# Patient Record
Sex: Male | Born: 1980 | Race: White | Hispanic: No | Marital: Single | Smoking: Current every day smoker
Health system: Southern US, Community
[De-identification: ages and names within clinical notes are randomized; demographics above are authoritative.]

## PROBLEM LIST (undated history)

## (undated) DIAGNOSIS — F1910 Other psychoactive substance abuse, uncomplicated: Secondary | ICD-10-CM

## (undated) HISTORY — PX: HERNIA REPAIR: SHX51

---
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
## 2012-04-19 ENCOUNTER — Emergency Department (HOSPITAL_COMMUNITY)
Admission: EM | Admit: 2012-04-19 | Discharge: 2012-04-19 | Disposition: A | Discharge status: Home / Self Care | Payer: Self-pay | Attending: Emergency Medicine | Admitting: Emergency Medicine

## 2012-04-19 ENCOUNTER — Encounter (HOSPITAL_COMMUNITY): Payer: Self-pay | Admitting: Emergency Medicine

## 2012-04-19 ENCOUNTER — Emergency Department (HOSPITAL_COMMUNITY): Payer: Self-pay

## 2012-04-19 DIAGNOSIS — J45909 Unspecified asthma, uncomplicated: Secondary | ICD-10-CM | POA: Insufficient documentation

## 2012-04-19 DIAGNOSIS — R0789 Other chest pain: Secondary | ICD-10-CM | POA: Insufficient documentation

## 2012-04-19 DIAGNOSIS — F1510 Other stimulant abuse, uncomplicated: Secondary | ICD-10-CM | POA: Insufficient documentation

## 2012-04-19 DIAGNOSIS — R05 Cough: Secondary | ICD-10-CM | POA: Insufficient documentation

## 2012-04-19 DIAGNOSIS — F1990 Other psychoactive substance use, unspecified, uncomplicated: Secondary | ICD-10-CM

## 2012-04-19 DIAGNOSIS — R059 Cough, unspecified: Secondary | ICD-10-CM | POA: Insufficient documentation

## 2012-04-19 DIAGNOSIS — R450 Nervousness: Secondary | ICD-10-CM | POA: Insufficient documentation

## 2012-04-19 DIAGNOSIS — R0982 Postnasal drip: Secondary | ICD-10-CM | POA: Insufficient documentation

## 2012-04-19 DIAGNOSIS — R0602 Shortness of breath: Secondary | ICD-10-CM | POA: Insufficient documentation

## 2012-04-19 DIAGNOSIS — R6889 Other general symptoms and signs: Secondary | ICD-10-CM | POA: Insufficient documentation

## 2012-04-19 DIAGNOSIS — F1410 Cocaine abuse, uncomplicated: Secondary | ICD-10-CM | POA: Insufficient documentation

## 2012-04-19 DIAGNOSIS — F17200 Nicotine dependence, unspecified, uncomplicated: Secondary | ICD-10-CM | POA: Insufficient documentation

## 2012-04-19 DIAGNOSIS — I498 Other specified cardiac arrhythmias: Secondary | ICD-10-CM | POA: Insufficient documentation

## 2012-04-19 DIAGNOSIS — R0682 Tachypnea, not elsewhere classified: Secondary | ICD-10-CM | POA: Insufficient documentation

## 2012-04-19 DIAGNOSIS — J3489 Other specified disorders of nose and nasal sinuses: Secondary | ICD-10-CM | POA: Insufficient documentation

## 2012-04-19 LAB — COMPREHENSIVE METABOLIC PANEL
ALT: 26 U/L (ref 0–53)
AST: 34 U/L (ref 0–37)
Albumin: 4.6 g/dL (ref 3.5–5.2)
Alkaline Phosphatase: 87 U/L (ref 39–117)
BUN: 17 mg/dL (ref 6–23)
Chloride: 102 mEq/L (ref 96–112)
Potassium: 3.2 mEq/L — ABNORMAL LOW (ref 3.5–5.1)
Sodium: 139 mEq/L (ref 135–145)
Total Bilirubin: 1.7 mg/dL — ABNORMAL HIGH (ref 0.3–1.2)
Total Protein: 7.7 g/dL (ref 6.0–8.3)

## 2012-04-19 LAB — RAPID URINE DRUG SCREEN, HOSP PERFORMED
Amphetamines: POSITIVE — AB
Barbiturates: NOT DETECTED
Tetrahydrocannabinol: NOT DETECTED

## 2012-04-19 LAB — POCT I-STAT TROPONIN I: Troponin i, poc: 0.01 ng/mL (ref 0.00–0.08)

## 2012-04-19 LAB — CBC
Hemoglobin: 16.8 g/dL (ref 13.0–17.0)
MCHC: 35.7 g/dL (ref 30.0–36.0)
Platelets: 163 10*3/uL (ref 150–400)

## 2012-04-19 LAB — DIFFERENTIAL
Basophils Absolute: 0 10*3/uL (ref 0.0–0.1)
Basophils Relative: 0 % (ref 0–1)
Eosinophils Absolute: 0.7 10*3/uL (ref 0.0–0.7)
Monocytes Relative: 12 % (ref 3–12)
Neutro Abs: 6.3 10*3/uL (ref 1.7–7.7)
Neutrophils Relative %: 64 % (ref 43–77)

## 2012-04-19 LAB — D-DIMER, QUANTITATIVE: D-Dimer, Quant: 0.39 ug/mL-FEU (ref 0.00–0.48)

## 2012-04-19 MED ORDER — LORAZEPAM 2 MG/ML IJ SOLN
1.0000 mg | Freq: Once | INTRAMUSCULAR | Status: DC
  Filled 2012-04-19: qty 1

## 2012-04-19 MED ORDER — LORAZEPAM 1 MG PO TABS: 1.0000 mg | ORAL_TABLET | Freq: Once | ORAL | Status: DC

## 2012-04-19 MED ORDER — ALBUTEROL SULFATE (5 MG/ML) 0.5% IN NEBU
5.0000 mg | INHALATION_SOLUTION | Freq: Once | RESPIRATORY_TRACT | Status: AC
  Administered 2012-04-19: 5 mg via RESPIRATORY_TRACT
  Filled 2012-04-19: qty 1

## 2012-04-19 MED ORDER — LORAZEPAM 1 MG PO TABS
2.0000 mg | ORAL_TABLET | Freq: Once | ORAL | Status: AC
  Administered 2012-04-19: 2 mg via ORAL
  Filled 2012-04-19: qty 2

## 2012-04-19 NOTE — ED Notes (Signed)
Pt noted to be very anxious, moving a lot and red in his face.  Reports that he is sleepy.  States that the last time he used 'meth' was 2 days ago and it was 'clear as glass'.  Reports that the last time that he used was in high school.  Pt not noted to be coughing, albuterol treatment finished.  No distress noted.  No wheezing.

## 2012-04-19 NOTE — ED Notes (Signed)
PT. REPORTS PROGRESSING SOB WITH PRODUCTIVE COUGH AND NASAL CONGESTION Kyle Wilkins NOSE FOR SEVERAL DAYS .

## 2012-04-19 NOTE — ED Notes (Signed)
Patient presents with dry cough and congestion.  Denies any other symptoms

## 2012-04-19 NOTE — ED Notes (Signed)
Meal provided.  Patient eating without difficulty.  Denies needs at present

## 2012-04-19 NOTE — ED Notes (Signed)
Attempted to ambulate patient- he remains unsteady on his gait.  Continue to try and reach someone to pick patient up.  He is currently unable to give any names or telephones.

## 2012-04-19 NOTE — Discharge Instructions (Signed)
Please read and follow all provided instructions.  Your diagnoses today include:  1. Drug use     Tests performed today include:  Chest x-ray which was normal  Blood counts and electrolytes that were normal  Vital signs. See below for your results today.   Medications prescribed:   None  Home care instructions:  Follow any educational materials contained in this packet.  Follow-up instructions: Please follow-up with your primary care provider in the next 3 days for further evaluation of your symptoms. If you do not have a primary care doctor -- see below for referral information.   Return instructions:   Please return to the Emergency Department if you experience worsening symptoms.   Please return if you have any other emergent concerns.  Additional Information:  Your vital signs today were: BP 137/83  Pulse 116  Temp(Src) 97.4 F (36.3 C) (Oral)  Resp 28  SpO2 94% If your blood pressure (BP) was elevated above 135/85 this visit, please have this repeated by your doctor within one month. -------------- No Primary Care Doctor Call [facility]  [phone] Other agencies that provide inexpensive medical care    [facility]  [phone]    [facility]  [phone]    [facility]  [phone]    [facility]  [phone]    [facility]  [phone]    [facility]  [phone] -------------- RESOURCE GUIDE:  Dental Problems  Patients with Medicaid: [facility] [phone] [address]                                            [phone] [address] Phone:  [phone]                                                   Phone:  [phone]  If unable to pay or uninsured, contact:  [facility] or [facility]. to become qualified for the adult dental clinic.  Chronic Pain Problems Contact [facility]  [phone] Patients need to be referred by  their primary care doctor.  Insufficient Money for Medicine Contact [employer]:  call "211" or [facility] [phone].  Psychological Services [facility]  [phone] [facility]  [phone] [facility]   [phone] (emergency services [phone])  Substance Abuse Resources [facility]  [phone] [facility] [phone] [facility] [place] [phone] Floydene Flock [phone] Residential & Outpatient Substance Abuse Program  [phone]  Abuse/Neglect [facility] Child Abuse Hotline [phone] [facility] Child Abuse Hotline 339-273-2442 (After Hours)  Emergency Shelter [facility] [phone]  Maternity Homes Room at the [place] [facility] [place] [phone] [place] [facility] [phone]  [facility]  [facility] [place]     [employer]                          [facility] Dept. [address] [place]  [address]      [address]  Blondell Reveal Phone:  [phone]                                   Phone:  [phone]                 Phone:  [phone]  [facility] Mental Health Phone:  [phone]  [facility] Child Abuse Hotline 2348353410 [phone] (After Hours)

## 2012-04-19 NOTE — ED Notes (Signed)
Patient friend Barbara Cower was called for a ride and message was left for him to call [facility] to pick up patient.

## 2012-04-19 NOTE — ED Notes (Signed)
Aunt is coming to pick up patient at  5:30.  Telephone [phone].

## 2012-04-19 NOTE — ED Notes (Signed)
Explained to Patient we need a urine sample.  Patient stated he did not need to urinate at this time.  Patient seemed to understand at this time.

## 2012-04-19 NOTE — ED Notes (Signed)
Friend on telephone with patient.

## 2012-04-19 NOTE — ED Provider Notes (Signed)
Medical screening examination/treatment/procedure(s) were performed by non-physician practitioner and as supervising physician I was immediately available for consultation/collaboration.  Sunnie Nielsen, MD 04/19/12 [phone]

## 2012-04-19 NOTE — ED Notes (Signed)
Discharged home in care of Jacinto Halim.   Written and verbal instructions given to patient/family

## 2012-04-19 NOTE — ED Provider Notes (Signed)
History     CSN: [id number]  Arrival date & time 04/19/12  4098   First MD Initiated Contact with Patient 04/19/12 [phone]      Chief Complaint  Patient presents with  . Cough    (Consider location/radiation/quality/duration/timing/severity/associated sxs/prior treatment) HPI Comments: 31 yo male with history of asthma presents to the ED this morning for increasing shortness of breath associated with wheezing, dry cough and nasal congestion.  Reports seasonal allergy symptoms  Including nasal congestion, itchy watery eyes, rhinorrhea, cough beginning 4 days ago (04/15/2012).  Two days ago patient developed SOB, wheezing, chest tightness when friend offered methamphetamine that was crushed into a tea to sooth his cough.  Patient states that this was when his cough and shortness of breath began.  It has made him anxious. No fever. Patient denies loss of consciousness, headache, abdominal pain, chest pain, nausea, vomiting, diarrhea, ear pain.  Patient is a 31 y.o. male presenting with cough. The history is provided by the patient.  Cough This is a new problem. The current episode started 2 days ago. The problem has not changed since onset.The cough is non-productive. There has been no fever. Associated symptoms include rhinorrhea, shortness of breath and wheezing. Pertinent negatives include no chest pain, no chills, no ear pain, no headaches, no sore throat and no myalgias. He has tried nothing for the symptoms. He is a smoker. His past medical history is significant for asthma.    Past Medical History  Diagnosis Date  . Asthma     Past Surgical History  Procedure Date  . Hernia repair     No family history on file.  History  Substance Use Topics  . Smoking status: Current Everyday Smoker  . Smokeless tobacco: Not on file  . Alcohol Use: Yes      Review of Systems  Constitutional: Negative for fever, chills and fatigue.  HENT: Positive for congestion, rhinorrhea, sneezing,  postnasal drip and sinus pressure. Negative for ear pain and sore throat.   Eyes: Positive for itching.  Respiratory: Positive for cough, chest tightness, shortness of breath and wheezing.   Cardiovascular: Negative for chest pain.  Gastrointestinal: Negative for nausea, vomiting, abdominal pain, diarrhea and constipation.  Genitourinary: Negative for dysuria.  Musculoskeletal: Negative for myalgias.  Skin: Negative for rash.  Neurological: Negative for dizziness, light-headedness and headaches.  Psychiatric/Behavioral: The patient is nervous/anxious and is hyperactive.     Allergies  Ceclor  Home Medications   Current Outpatient Rx  Name Route Sig Dispense Refill  . FEXOFENADINE HCL 180 MG PO TABS Oral Take 180 mg by mouth daily.      BP 130/92  Pulse 108  Temp(Src) 97.6 F (36.4 C) (Oral)  Resp 24  SpO2 96%  Physical Exam  Nursing note and vitals reviewed. Constitutional: He is oriented to person, place, and time. He appears well-developed and well-nourished.       Patient is restless  HENT:  Head: Normocephalic and atraumatic.  Right Ear: External ear normal.  Left Ear: External ear normal.  Mouth/Throat: Oropharynx is clear and moist and mucous membranes are normal. Mucous membranes are not dry. No oropharyngeal exudate.       Rhinorrhea  Eyes: Conjunctivae and EOM are normal. Pupils are equal, round, and reactive to light. Right eye exhibits no discharge. Left eye exhibits no discharge.       Pupils dilated  Neck: Trachea normal and normal range of motion. Neck supple. Normal carotid pulses and no JVD present. No  muscular tenderness present. Carotid bruit is not present. No tracheal deviation present.  Cardiovascular: Normal rate, regular rhythm, S1 normal, S2 normal, normal heart sounds and intact distal pulses.  Exam reveals no distant heart sounds and no decreased pulses.   No murmur heard.      HR in 90's during exam  Pulmonary/Chest: Effort normal and breath  sounds normal. No respiratory distress. He has no wheezes. He exhibits no tenderness.       Tachypnea with expiratory wheezes in all lung fields.  Abdominal: Soft. Normal aorta and bowel sounds are normal. There is no tenderness. There is no rebound and no guarding.  Musculoskeletal: He exhibits no edema.  Neurological: He is alert and oriented to person, place, and time.  Skin: Skin is warm and dry. He is not diaphoretic. No cyanosis. No pallor.  Psychiatric: He has a normal mood and affect.       Patient hypervigilant with increased rate of speech and akathisia.      ED Course  Procedures (including critical care time)  Labs Reviewed  DIFFERENTIAL - Abnormal; Notable for the following:    Monocytes Absolute 1.2 (*)    Eosinophils Relative 7 (*)    All other components within normal limits  COMPREHENSIVE METABOLIC PANEL - Abnormal; Notable for the following:    Potassium 3.2 (*)    Total Bilirubin 1.7 (*)    GFR calc non Af Amer 88 (*)    All other components within normal limits  URINE RAPID DRUG SCREEN (HOSP PERFORMED) - Abnormal; Notable for the following:    Cocaine POSITIVE (*)    Amphetamines POSITIVE (*)    All other components within normal limits  CBC  D-DIMER, QUANTITATIVE  POCT I-STAT TROPONIN I   Dg Chest 2 View  04/19/2012  *RADIOLOGY REPORT*  Clinical Data: Cough  CHEST - 2 VIEW  Comparison: None.  Findings: Lungs are clear. No pleural effusion or pneumothorax. The cardiomediastinal contours are within normal limits. The visualized bones and soft tissues are without significant appreciable abnormality.  IMPRESSION: No acute cardiopulmonary process.  Original Report Authenticated By: Waneta Martins, M.D.     1. Drug use     6:50AM Patient seen and examined. CXR reviewed by myself. Normal per radiologist. Work-up initiated. Medications ordered. Discussed with Dr. Dierdre Highman.   Vital signs reviewed and are as follows: Filed Vitals:   04/19/12 0554  BP: 130/92    Pulse: 108  Temp: 97.6 F (36.4 C)  Resp: 24   EKG ordered, breathing treatment ordered.    Date: 04/19/2012  Rate: 111  Rhythm: sinus tachycardia  QRS Axis: normal  Intervals: normal  ST/T Wave abnormalities: nonspecific T wave changes  Conduction Disutrbances:none  Narrative Interpretation:   Old EKG Reviewed: none available  7:53 AM Wheezing resolved. Patient drops to 92% when taken off O2. He is responsive to voice but falls asleep quickly. Patient states his breathing is much better. D/w Dr. Manus Gunning. Labs ordered. Ativan not given, will hold given borderline hypoxia.   9:37 AM Patient re-examined. Exam unchanged.   10:53 AM Patient is much more coherent. He knows address and birthday. Will continue monitoring. Pending UDS. Will hold head CT.   4:17 PM Patient has been re-evaluated several times by myself and Dr. Manus Gunning. He has continued to improve. He is eating and drinking. He is ambulating in hallway. Awaiting safe transportation for patient from hospital.   4:37 PM Dr. Annetta Maw PA-C aware of patient awaiting discharge.  MDM  Patient who admits to methamphetamine use. Cocaine positive UDS. Tachycardic at times in ED. D-dimer neg. CXR neg. Mental status has continued to improve. No headache. Patient is ambulatory. No indication for head CT. Do not suspect EtOH withdrawals.         Renne Crigler, [place] 04/19/12 [phone]

## 2012-04-19 NOTE — ED Notes (Signed)
Pt put on 2L [place]

## 2012-04-19 NOTE — ED Notes (Signed)
In to check on patient- found sitting up in bed crying stating "just read a piece of paper that he was diagnosed with AIDS"- papers found in bed are blank pieces of paper from [facility] and the application has not been completed.  Assured patient that papers found were blank and no diagnosis was found.  Resting more quietly at present. VS WNL

## 2012-12-31 ENCOUNTER — Emergency Department (HOSPITAL_COMMUNITY)
Admission: EM | Admit: 2012-12-31 | Discharge: 2012-12-31 | Disposition: A | Discharge status: Home / Self Care | Payer: Self-pay | Attending: Emergency Medicine | Admitting: Emergency Medicine

## 2012-12-31 ENCOUNTER — Emergency Department (HOSPITAL_COMMUNITY): Payer: Self-pay

## 2012-12-31 ENCOUNTER — Encounter (HOSPITAL_COMMUNITY): Payer: Self-pay | Admitting: Emergency Medicine

## 2012-12-31 DIAGNOSIS — W108XXA Fall (on) (from) other stairs and steps, initial encounter: Secondary | ICD-10-CM | POA: Insufficient documentation

## 2012-12-31 DIAGNOSIS — F17200 Nicotine dependence, unspecified, uncomplicated: Secondary | ICD-10-CM | POA: Insufficient documentation

## 2012-12-31 DIAGNOSIS — Y939 Activity, unspecified: Secondary | ICD-10-CM | POA: Insufficient documentation

## 2012-12-31 DIAGNOSIS — S99919A Unspecified injury of unspecified ankle, initial encounter: Secondary | ICD-10-CM | POA: Insufficient documentation

## 2012-12-31 DIAGNOSIS — Z79899 Other long term (current) drug therapy: Secondary | ICD-10-CM | POA: Insufficient documentation

## 2012-12-31 DIAGNOSIS — X500XXA Overexertion from strenuous movement or load, initial encounter: Secondary | ICD-10-CM | POA: Insufficient documentation

## 2012-12-31 DIAGNOSIS — Y929 Unspecified place or not applicable: Secondary | ICD-10-CM | POA: Insufficient documentation

## 2012-12-31 DIAGNOSIS — S8990XA Unspecified injury of unspecified lower leg, initial encounter: Secondary | ICD-10-CM | POA: Insufficient documentation

## 2012-12-31 DIAGNOSIS — J45909 Unspecified asthma, uncomplicated: Secondary | ICD-10-CM | POA: Insufficient documentation

## 2012-12-31 MED ORDER — TRAMADOL HCL 50 MG PO TABS
50.0000 mg | ORAL_TABLET | Freq: Once | ORAL | Status: AC
  Administered 2012-12-31: 50 mg via ORAL
  Filled 2012-12-31: qty 1

## 2012-12-31 MED ORDER — TRAMADOL HCL 50 MG PO TABS: 50.0000 mg | ORAL_TABLET | Freq: Four times a day (QID) | ORAL | Status: DC | PRN

## 2012-12-31 NOTE — ED Notes (Signed)
Pt. Stated, i fell down my stairs about a hour ago and my rt. Knee is throbbing

## 2012-12-31 NOTE — Progress Notes (Signed)
Orthopedic Tech Progress Note Patient Details:  Kyle Wilkins March 11, 1981 [id number]  Ortho Devices Type of Ortho Device: Knee Sleeve Ortho Device/Splint Location: LEFT KNEE SUPPORT Ortho Device/Splint Interventions: Application   Shawnie Pons 12/31/2012, 1:37 PM

## 2012-12-31 NOTE — ED Provider Notes (Signed)
History     CSN: [id number]  Arrival date & time 12/31/12  1130   First MD Initiated Contact with Patient 12/31/12 1312      Chief Complaint  Patient presents with  . Knee Pain    (Consider location/radiation/quality/duration/timing/severity/associated sxs/prior treatment) HPI Comments: Patient is a 32 year old male who presents with right knee pain that started suddenly today after falling down the stairs and twisting his knee. The pain is progressively worsening. The pain is throbbing and severe. Movement and weight bearing activity make the pain worse. Nothing makes the pain better. Patient has not tried anything for pain relief. Patient reports associated mild swelling.    Past Medical History  Diagnosis Date  . Asthma     Past Surgical History  Procedure Date  . Hernia repair     No family history on file.  History  Substance Use Topics  . Smoking status: Current Every Day Smoker  . Smokeless tobacco: Not on file  . Alcohol Use: Yes      Review of Systems  Musculoskeletal: Positive for arthralgias.  All other systems reviewed and are negative.    Allergies  Ceclor  Home Medications   Current Outpatient Rx  Name  Route  Sig  Dispense  Refill  . ALBUTEROL SULFATE HFA 108 (90 BASE) MCG/ACT IN AERS   Inhalation   Inhale 2 puffs into the lungs every 6 (six) hours as needed. As needed for shortness of breath.           BP 115/78  Pulse 110  Temp 97.8 F (36.6 C) (Oral)  Resp 17  SpO2 95%  Physical Exam  Nursing note and vitals reviewed. Constitutional: He is oriented to person, place, and time. He appears well-developed and well-nourished. No distress.  HENT:  Head: Normocephalic and atraumatic.  Eyes: Conjunctivae normal are normal.  Neck: Normal range of motion. Neck supple.  Cardiovascular: Normal rate, regular rhythm and intact distal pulses.  Exam reveals no gallop and no friction rub.   No murmur heard. Pulmonary/Chest: Effort normal  and breath sounds normal. He has no wheezes. He has no rales. He exhibits no tenderness.  Abdominal: Soft. There is no tenderness.  Musculoskeletal: Normal range of motion.       Tenderness to palpation of medial aspect of left knee. No edema or obvious deformity noted.   Neurological: He is alert and oriented to person, place, and time. Coordination normal.       Strength and sensation equal and intact bilaterally. Speech is goal-oriented. Moves limbs without ataxia.   Skin: Skin is warm. He is not diaphoretic.  Psychiatric: He has a normal mood and affect. His behavior is normal.    ED Course  Procedures (including critical care time)  Labs Reviewed - No data to display Dg Knee Complete 4 Views Left  12/31/2012  *RADIOLOGY REPORT*  Clinical Data: History of trauma from a fall complaining of pain in the medial aspect of the left knee.  LEFT KNEE - COMPLETE 4+ VIEW  Comparison: No priors.  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  No joint effusion.  No definite evidence of acute displaced fracture, subluxation or dislocation.  Soft tissue swelling medial to the left knee.  IMPRESSION: 1.  Appearance along the superomedial aspect of the medial femoral condyle is suggestive of early Pelligrini-Stieda disease. 2.  There is a soft tissue swelling medial to the left knee. However, there is no acute displaced fracture, and no joint effusion, lipohemarthrosis or other definite findings to suggest an occult nondisplaced acute fracture.   Original Report Authenticated By: Trudie Reed, M.D.      1. Knee injury       MDM  1:18 PM Xray unremarkable. Patient will have a knee sleeve and tramadol for pain. Patient denied crutches. I will recommend a orthopedic follow up for further evaluation and management. No signs of neurovascular  compromise. Distal pulses intact. He will have tramadol prescription and instructions to rest, ice and elevate.         Emilia Beck, PA-C 01/02/13 2303

## 2013-01-05 NOTE — ED Provider Notes (Signed)
Medical screening examination/treatment/procedure(s) were performed by non-physician practitioner and as supervising physician I was immediately available for consultation/collaboration. Benaiah Behan, MD, FACEP    Syniyah Bourne L Esta Carmon, MD 01/05/13 0005 

## 2013-03-30 IMAGING — CR DG CHEST 2V
2 series · 2 of 2 positions shown · non-contrast
Comparison: None.

CLINICAL DATA: Cough

CHEST - 2 VIEW

[w chest pa]
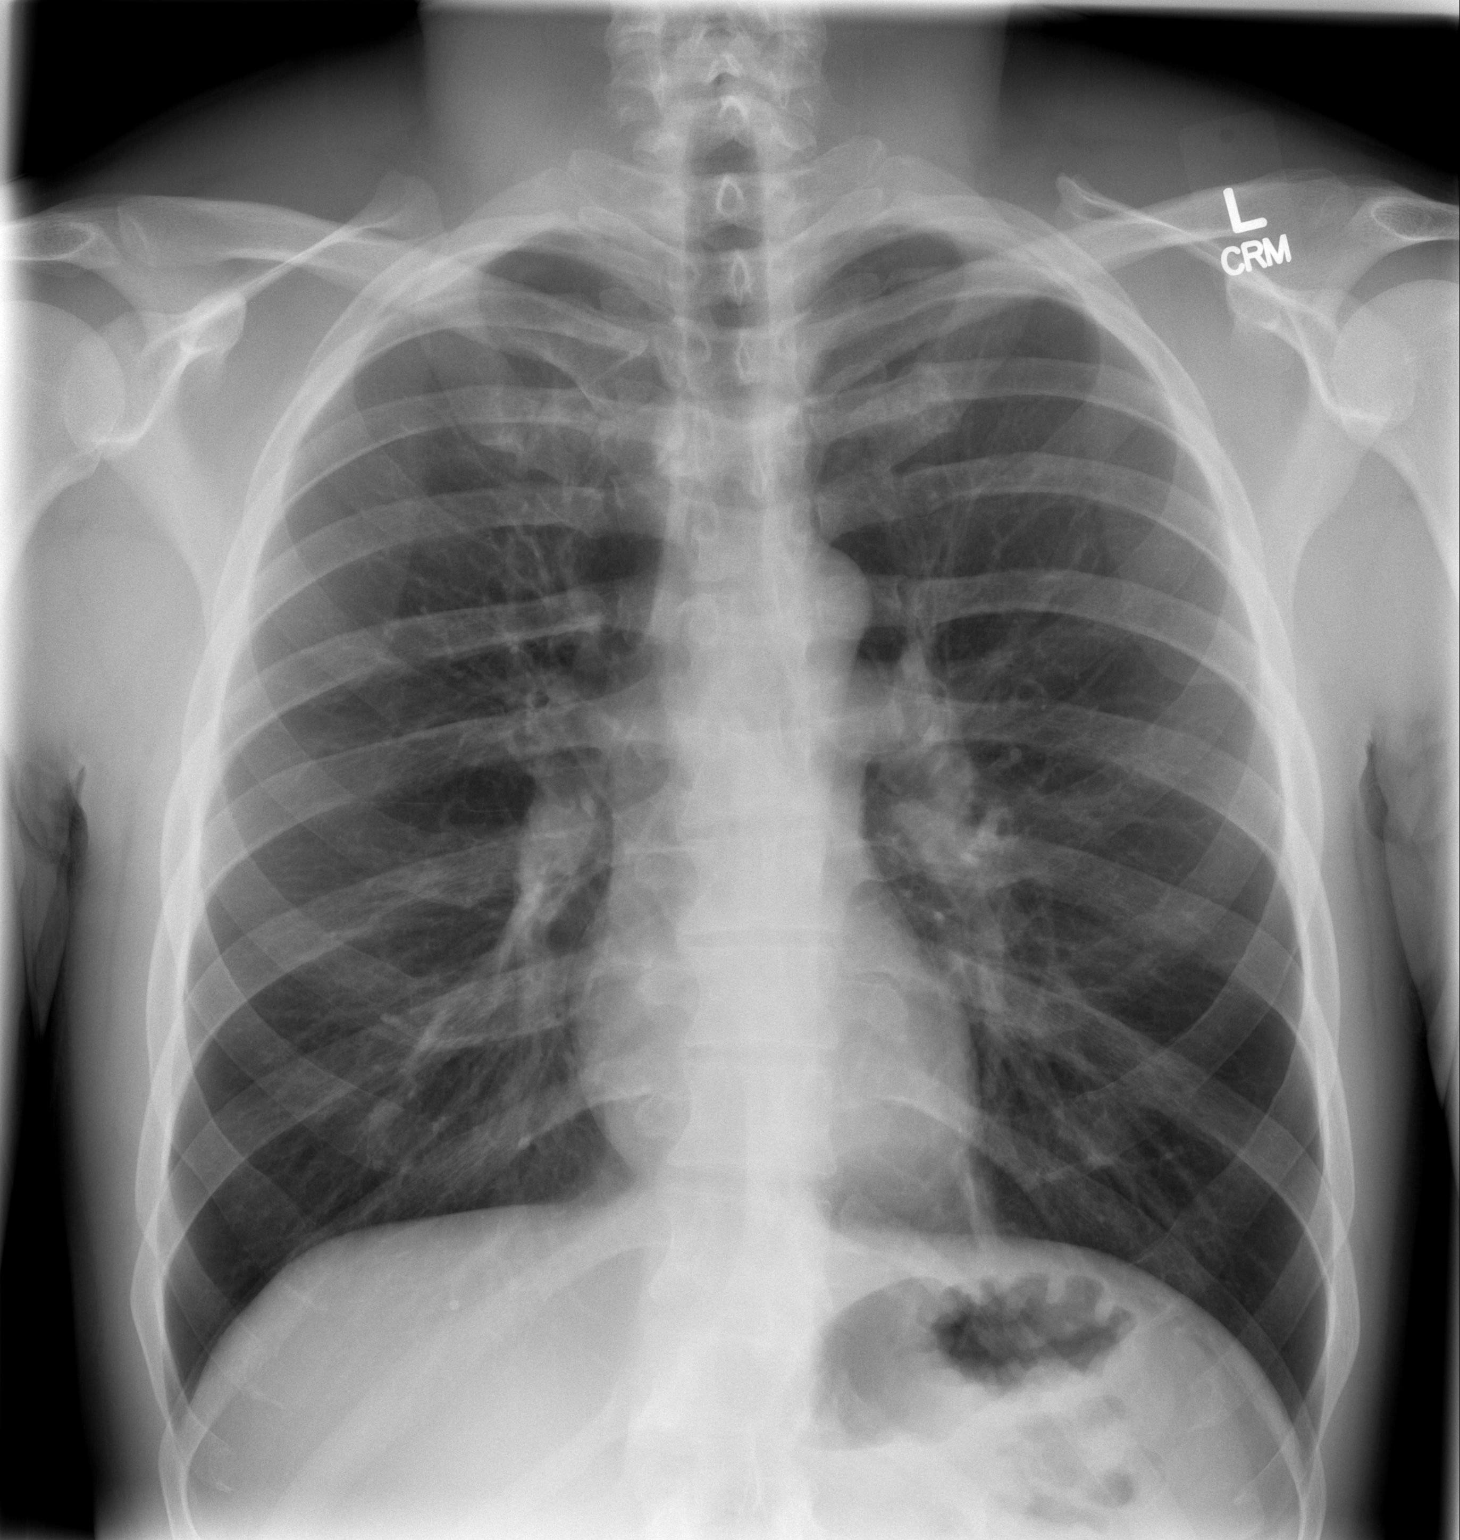

[w chest lat]
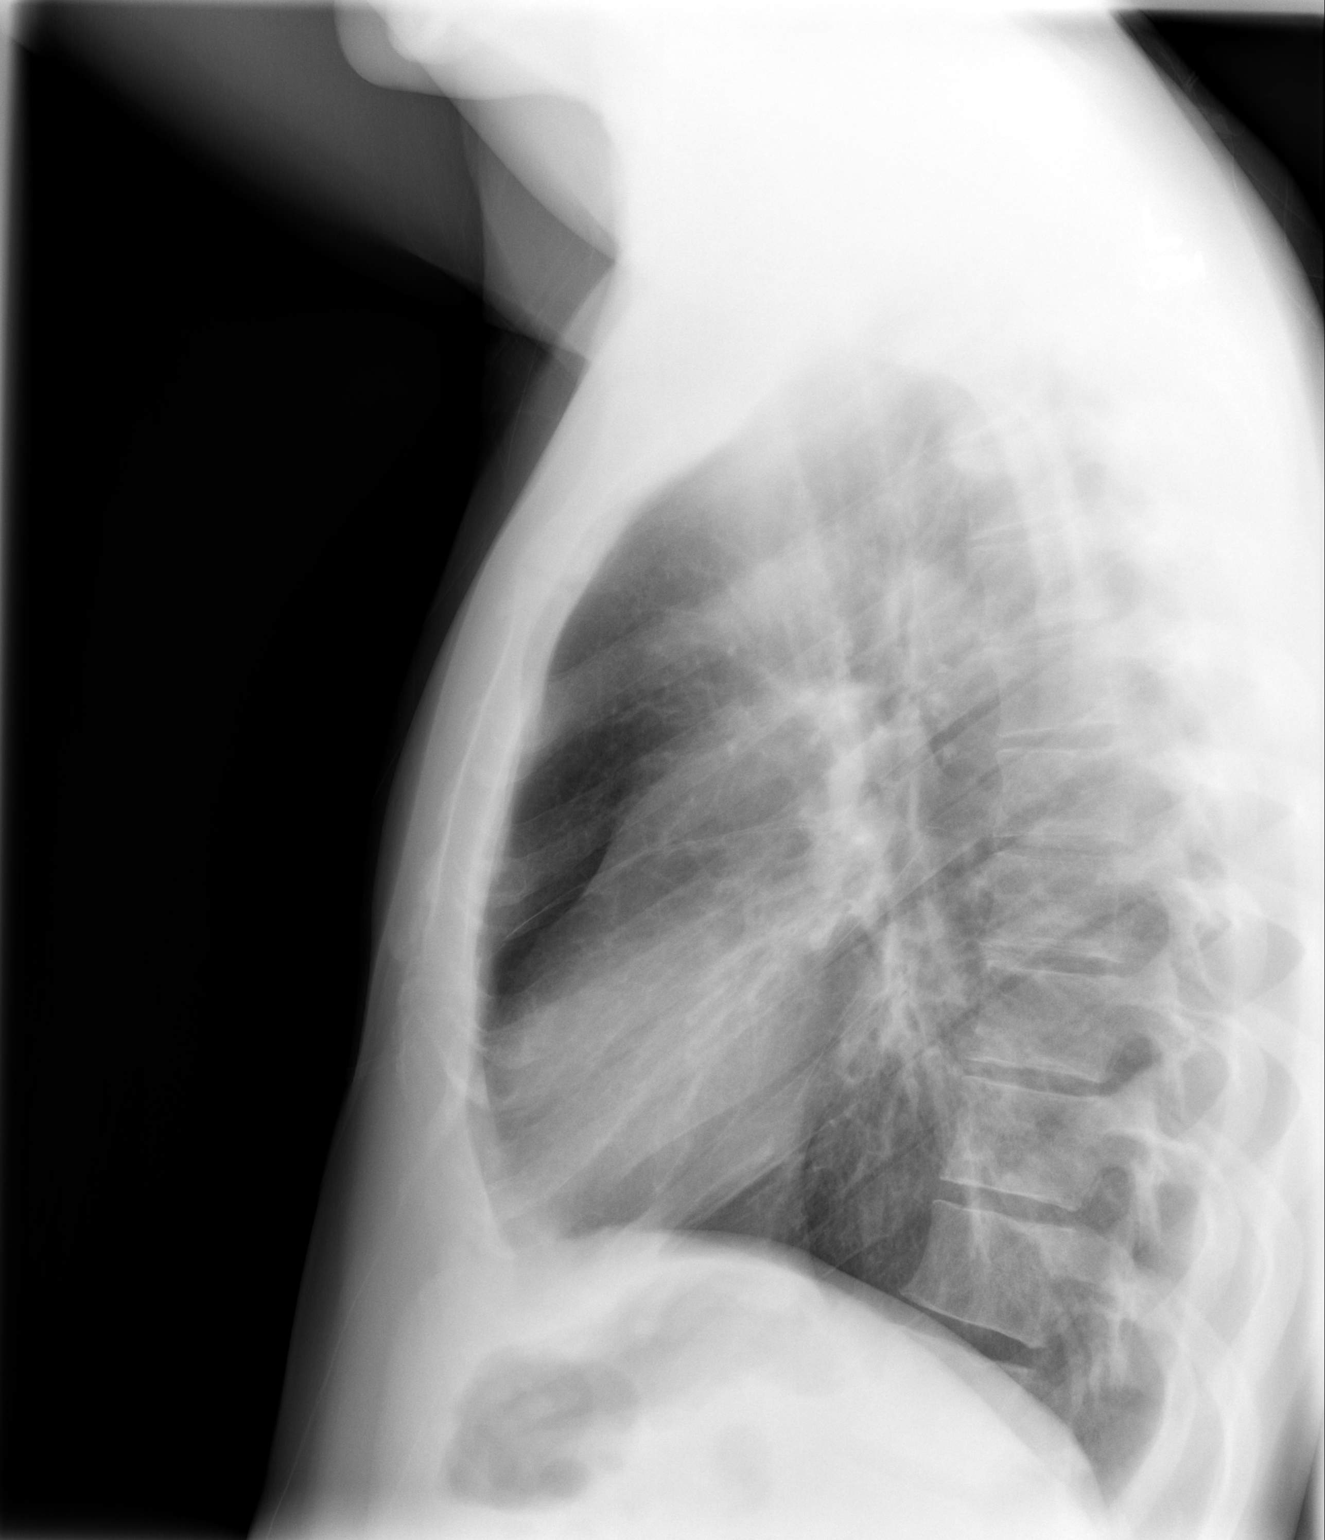

[2 of 2 positions shown; findings below may reference images not displayed]

FINDINGS: Lungs are clear. No pleural effusion or pneumothorax. The
cardiomediastinal contours are within normal limits. The visualized
bones and soft tissues are without significant appreciable
abnormality.
IMPRESSION: No acute cardiopulmonary process.

## 2013-07-31 ENCOUNTER — Encounter (HOSPITAL_COMMUNITY): Payer: Self-pay | Admitting: Internal Medicine

## 2013-07-31 ENCOUNTER — Inpatient Hospital Stay (HOSPITAL_COMMUNITY)
Admission: EM | Admit: 2013-07-31 | Discharge: 2013-08-01 | DRG: 897 | Attending: Internal Medicine | Admitting: Internal Medicine

## 2013-07-31 DIAGNOSIS — F19921 Other psychoactive substance use, unspecified with intoxication with delirium: Principal | ICD-10-CM

## 2013-07-31 DIAGNOSIS — N289 Disorder of kidney and ureter, unspecified: Secondary | ICD-10-CM

## 2013-07-31 DIAGNOSIS — T50904A Poisoning by unspecified drugs, medicaments and biological substances, undetermined, initial encounter: Secondary | ICD-10-CM

## 2013-07-31 DIAGNOSIS — T50901A Poisoning by unspecified drugs, medicaments and biological substances, accidental (unintentional), initial encounter: Secondary | ICD-10-CM

## 2013-07-31 DIAGNOSIS — F1910 Other psychoactive substance abuse, uncomplicated: Secondary | ICD-10-CM | POA: Diagnosis present

## 2013-07-31 DIAGNOSIS — D72829 Elevated white blood cell count, unspecified: Secondary | ICD-10-CM | POA: Diagnosis present

## 2013-07-31 DIAGNOSIS — F17200 Nicotine dependence, unspecified, uncomplicated: Secondary | ICD-10-CM | POA: Diagnosis present

## 2013-07-31 DIAGNOSIS — M6282 Rhabdomyolysis: Secondary | ICD-10-CM

## 2013-07-31 DIAGNOSIS — T50905A Adverse effect of unspecified drugs, medicaments and biological substances, initial encounter: Secondary | ICD-10-CM | POA: Diagnosis present

## 2013-07-31 DIAGNOSIS — E876 Hypokalemia: Secondary | ICD-10-CM | POA: Diagnosis present

## 2013-07-31 DIAGNOSIS — N179 Acute kidney failure, unspecified: Secondary | ICD-10-CM

## 2013-07-31 LAB — TROPONIN I: Troponin I: <0.3 ng/mL (ref <0.30)

## 2013-07-31 LAB — COMPREHENSIVE METABOLIC PANEL
AST: 54 U/L — ABNORMAL HIGH (ref 0–37)
AST: 42 U/L — ABNORMAL HIGH (ref 0–37)
Albumin: 4.8 g/dL (ref 3.5–5.2)
Albumin: 3.3 g/dL — ABNORMAL LOW (ref 3.5–5.2)
BUN: 22 mg/dL (ref 6–23)
Calcium: 7.9 mg/dL — ABNORMAL LOW (ref 8.4–10.5)
Chloride: 95 mEq/L — ABNORMAL LOW (ref 96–112)
Creatinine, Ser: 1.52 mg/dL — ABNORMAL HIGH (ref 0.50–1.35)
Creatinine, Ser: 1.1 mg/dL (ref 0.50–1.35)
GFR calc non Af Amer: 87 mL/min — ABNORMAL LOW (ref >90)
Potassium: 3.2 mEq/L — ABNORMAL LOW (ref 3.5–5.1)
Total Bilirubin: 2.1 mg/dL — ABNORMAL HIGH (ref 0.3–1.2)
Total Protein: 7.7 g/dL (ref 6.0–8.3)
Total Protein: 5.7 g/dL — ABNORMAL LOW (ref 6.0–8.3)

## 2013-07-31 LAB — RAPID URINE DRUG SCREEN, HOSP PERFORMED
Amphetamines: POSITIVE — AB
Benzodiazepines: POSITIVE — AB
Cocaine: POSITIVE — AB

## 2013-07-31 LAB — CBC WITH DIFFERENTIAL/PLATELET
Basophils Absolute: 0 10*3/uL (ref 0.0–0.1)
Basophils Relative: 0 % (ref 0–1)
Eosinophils Absolute: 0.1 10*3/uL (ref 0.0–0.7)
Eosinophils Absolute: 0.1 10*3/uL (ref 0.0–0.7)
Hemoglobin: 16.7 g/dL (ref 13.0–17.0)
Hemoglobin: 14 g/dL (ref 13.0–17.0)
Lymphs Abs: 1.8 10*3/uL (ref 0.7–4.0)
MCH: 31.7 pg (ref 26.0–34.0)
MCH: 31.4 pg (ref 26.0–34.0)
MCHC: 35.7 g/dL (ref 30.0–36.0)
MCV: 90.8 fL (ref 78.0–100.0)
Monocytes Absolute: 1.6 10*3/uL — ABNORMAL HIGH (ref 0.1–1.0)
Monocytes Relative: 13 % — ABNORMAL HIGH (ref 3–12)
Monocytes Relative: 13 % — ABNORMAL HIGH (ref 3–12)
Neutro Abs: 9 10*3/uL — ABNORMAL HIGH (ref 1.7–7.7)
Neutrophils Relative %: 72 % (ref 43–77)
Neutrophils Relative %: 60 % (ref 43–77)
RBC: 4.46 MIL/uL (ref 4.22–5.81)
RDW: 12.7 % (ref 11.5–15.5)

## 2013-07-31 LAB — GLUCOSE, CAPILLARY
Glucose-Capillary: 71 mg/dL (ref 70–99)
Glucose-Capillary: 94 mg/dL (ref 70–99)
Glucose-Capillary: 57 mg/dL — ABNORMAL LOW (ref 70–99)
Glucose-Capillary: 82 mg/dL (ref 70–99)
Glucose-Capillary: 72 mg/dL (ref 70–99)

## 2013-07-31 LAB — SALICYLATE LEVEL: Salicylate Lvl: <2 mg/dL — ABNORMAL LOW (ref 2.8–20.0)

## 2013-07-31 LAB — ACETAMINOPHEN LEVEL: Acetaminophen (Tylenol), Serum: <15 ug/mL (ref 10–30)

## 2013-07-31 LAB — ETHANOL: Alcohol, Ethyl (B): <11 mg/dL (ref 0–11)

## 2013-07-31 LAB — CK
Total CK: 1841 U/L — ABNORMAL HIGH (ref 7–232)
Total CK: 1345 U/L — ABNORMAL HIGH (ref 7–232)

## 2013-07-31 MED ORDER — DEXTROSE 50 % IV SOLN
INTRAVENOUS | Status: AC
  Filled 2013-07-31: qty 50

## 2013-07-31 MED ORDER — DEXTROSE-NACL 5-0.45 % IV SOLN
INTRAVENOUS | Status: DC
  Administered 2013-07-31 – 2013-08-01 (×3): via INTRAVENOUS

## 2013-07-31 MED ORDER — ACETAMINOPHEN 650 MG RE SUPP: 650.0000 mg | Freq: Four times a day (QID) | RECTAL | Status: DC | PRN

## 2013-07-31 MED ORDER — ONDANSETRON HCL 4 MG/2ML IJ SOLN: 4.0000 mg | Freq: Four times a day (QID) | INTRAMUSCULAR | Status: DC | PRN

## 2013-07-31 MED ORDER — SODIUM CHLORIDE 0.9 % IJ SOLN
3.0000 mL | Freq: Two times a day (BID) | INTRAMUSCULAR | Status: DC
  Administered 2013-07-31: 3 mL via INTRAVENOUS

## 2013-07-31 MED ORDER — ACETAMINOPHEN 325 MG PO TABS: 650.0000 mg | ORAL_TABLET | Freq: Four times a day (QID) | ORAL | Status: DC | PRN

## 2013-07-31 MED ORDER — LORAZEPAM 2 MG/ML IJ SOLN
2.0000 mg | Freq: Once | INTRAMUSCULAR | Status: AC
  Administered 2013-07-31: 2 mg via INTRAVENOUS

## 2013-07-31 MED ORDER — ALBUTEROL SULFATE (5 MG/ML) 0.5% IN NEBU: 2.5000 mg | INHALATION_SOLUTION | RESPIRATORY_TRACT | Status: DC | PRN

## 2013-07-31 MED ORDER — SODIUM CHLORIDE 0.9 % IV SOLN
INTRAVENOUS | Status: DC
  Administered 2013-07-31: 06:00:00 via INTRAVENOUS

## 2013-07-31 MED ORDER — SODIUM CHLORIDE 0.9 % IV BOLUS (SEPSIS)
1000.0000 mL | Freq: Once | INTRAVENOUS | Status: AC
  Administered 2013-07-31: 1000 mL via INTRAVENOUS

## 2013-07-31 MED ORDER — THIAMINE HCL 100 MG/ML IJ SOLN
100.0000 mg | Freq: Every day | INTRAMUSCULAR | Status: DC
  Administered 2013-07-31 – 2013-08-01 (×2): 100 mg via INTRAVENOUS
  Filled 2013-07-31 (×2): qty 1

## 2013-07-31 MED ORDER — LORAZEPAM 2 MG/ML IJ SOLN
2.0000 mg | Freq: Once | INTRAMUSCULAR | Status: DC
  Filled 2013-07-31: qty 1

## 2013-07-31 MED ORDER — PNEUMOCOCCAL VAC POLYVALENT 25 MCG/0.5ML IJ INJ
0.5000 mL | INJECTION | INTRAMUSCULAR | Status: AC
  Administered 2013-08-01: 0.5 mL via INTRAMUSCULAR
  Filled 2013-07-31 (×3): qty 0.5

## 2013-07-31 MED ORDER — ONDANSETRON HCL 4 MG/2ML IJ SOLN: 4.0000 mg | Freq: Three times a day (TID) | INTRAMUSCULAR | Status: AC | PRN

## 2013-07-31 MED ORDER — LORAZEPAM 2 MG/ML IJ SOLN
1.0000 mg | INTRAMUSCULAR | Status: DC | PRN
  Administered 2013-07-31 (×3): 1 mg via INTRAVENOUS
  Filled 2013-07-31 (×3): qty 1

## 2013-07-31 MED ORDER — DEXTROSE 50 % IV SOLN
25.0000 mL | Freq: Once | INTRAVENOUS | Status: AC
  Administered 2013-07-31: 25 mL via INTRAVENOUS

## 2013-07-31 MED ORDER — ONDANSETRON HCL 4 MG PO TABS: 4.0000 mg | ORAL_TABLET | Freq: Four times a day (QID) | ORAL | Status: DC | PRN

## 2013-07-31 MED ORDER — POTASSIUM CHLORIDE 10 MEQ/100ML IV SOLN
10.0000 meq | INTRAVENOUS | Status: AC
  Administered 2013-07-31 (×2): 10 meq via INTRAVENOUS
  Filled 2013-07-31: qty 200

## 2013-07-31 NOTE — Progress Notes (Signed)
Patient too sedated to activate MyChart

## 2013-07-31 NOTE — H&P (Addendum)
[facility] History and Physical  Kyle Wilkins ZOX:[id number] DOB: 1981/02/02 DOA: 07/31/2013  Referring physician: ER physician. PCP: Default, Provider, MD   Patient is presently confused and most of the history was obtained from ER physician, patient's nurse and the police officer.  Chief Complaint: Agitation.  HPI: Kyle Wilkins is a 32 y.o. male history of asthma was arrested by the police when patient was trying to break in a house. At that time patient looked confused so EMS was called and after which patient was taken to the jail. In the jail patient became more confused and agitated and as per the police officer and ER physician patient had agreed to have taken methamphetamine, Adderall and Vyvanse. One of which he took per rectally. Patient became more agitated and was brought to the ER. Patient in the ER was found to be delirious and was given Ativan 2 mg IV one dose after which patient has become more calm. On my exam patient is quite sedated and does not provide any history and does not follow any commands but is arousable easily. Patient's drug screen is positive for amphetamine cocaine benzodiazepine and marijuana. Patient has been admitted for further management. In addition patient was also show renal failure and rhabdomyolysis.  Review of Systems: As presented in the history of presenting illness, rest negative.  Past Medical History  Diagnosis Date  . Asthma    Past Surgical History  Procedure Laterality Date  . Hernia repair     Social History:  reports that he has been smoking.  He does not have any smokeless tobacco history on file. He reports that  drinks alcohol. He reports that he does not use illicit drugs. Not known. where does patient live-- Not sure. Can patient participate in ADLs?  Allergies  Allergen Reactions  . Ceclor (Cefaclor) Other (See Comments)    unknown    History reviewed. No pertinent family history.    Prior to Admission  medications   Medication Sig Start Date End Date Taking? Authorizing Provider  albuterol (PROVENTIL HFA;VENTOLIN HFA) 108 (90 BASE) MCG/ACT inhaler Inhale 2 puffs into the lungs every 6 (six) hours as needed. As needed for shortness of breath.    Historical Provider, MD  traMADol (ULTRAM) 50 MG tablet Take 1 tablet (50 mg total) by mouth every 6 (six) hours as needed for pain. 12/31/12   Emilia Beck, PA-C   Physical Exam: Filed Vitals:   07/31/13 0037 07/31/13 0339 07/31/13 0400  BP: 124/63 122/66   Pulse: 156 93   Temp: 98.2 F (36.8 C)  97.7 F (36.5 C)  TempSrc: Oral  Rectal  Resp: 30    SpO2: 99%       General:  Well-developed and nourished.  Eyes: Perla positive.  ENT: No discharge from ears eyes nose or mouth.  Neck: No mass felt or neck rigidity.  Cardiovascular: S1-S2 heard tachycardic.  Respiratory: No rhonchi or crepitations.  Abdomen: Soft nontender bowel sounds present.  Skin: No rash.  Musculoskeletal: No edema.  Psychiatric: Patient presently is sedated.  Neurologic: Patient presently is sedated.  Labs on Admission:  Basic Metabolic Panel:  Recent Labs Lab 07/31/13 0048  NA 137  K 3.2*  CL 95*  CO2 21  GLUCOSE 56*  BUN 22  CREATININE 1.52*  CALCIUM 9.8   Liver Function Tests:  Recent Labs Lab 07/31/13 0048  AST 54*  ALT 25  ALKPHOS 75  BILITOT 2.1*  PROT 7.7  ALBUMIN 4.8  No results found for this basename: LIPASE, AMYLASE,  in the last 168 hours No results found for this basename: AMMONIA,  in the last 168 hours CBC:  Recent Labs Lab 07/31/13 0048  WBC 12.4*  NEUTROABS 9.0*  HGB 16.7  HCT 46.8  MCV 89.0  PLT 207   Cardiac Enzymes:  Recent Labs Lab 07/31/13 0048  CKTOTAL 1841*    BNP (last 3 results) No results found for this basename: PROBNP,  in the last 8760 hours CBG: No results found for this basename: GLUCAP,  in the last 168 hours  Radiological Exams on Admission: No results found.  EKG:  Independently reviewed. Sinus tachycardia.  Assessment/Plan Principal Problem:   Drug overdose Active Problems:   Polysubstance abuse   Rhabdomyolysis   ARF (acute renal failure)   1. Acute delirium from multiple drugs secondary to polysubstance abuse - will admit patient to step down for closer observation. When necessary Ativan IV for agitation. Thiamine and aggressive IV hydration. Closely follow CBGs. 2. Mild rhabdomyolysis - continue with hydration and closely follow CK levels. Since patient also has positive cocaine check troponin. 3. Acute renal failure - continue with hydration and closely follow intake output and metabolic panel. Check urine analysis. 4. History of asthma - presently not wheezing. 5. Mild leukocytosis - probably reactionary. Presently afebrile.  Patient is presently sedated and does not provide any history. Detailed history has to be taken once patient is alert awake.    Code Status: Full code.  Family Communication: None.  Disposition Plan: Admit to inpatient.    Javel Hersh N. [facility] Pager [phone].  If 7PM-7, please contact night-coverage [email or web address] Password [facility] 07/31/2013, 4:58 AM

## 2013-07-31 NOTE — ED Notes (Signed)
Bed:WA18<BR> Expected date:<BR> Expected time:<BR> Means of arrival:<BR> Comments:<BR> EMS
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
## 2013-07-31 NOTE — ED Notes (Signed)
Pt told police that he had taken adderall with his methanphetomine.

## 2013-07-31 NOTE — ED Notes (Signed)
Pt given midazolam by EMS in route.

## 2013-07-31 NOTE — ED Notes (Signed)
Pt arrived via EMS from home with a complaint of medical clearance.  Pt was picked up by police for agitation and aggressive behavior.  Pt stated to police that he had taken methamphetamine yesterday.  Police transported patient to the jail but pt was to aggressive to stay in holding so EMS was called to the jail.   Pt is tachycardic at this time at 145

## 2013-07-31 NOTE — ED Notes (Signed)
ASSIGNED ROOM [id number] 

## 2013-07-31 NOTE — Progress Notes (Signed)
[facility] PROGRESS NOTE  Assessment/Plan: Delirium, drug-induced due to   *Drug overdose - IV ativan, IV fluid, thiamine. - ETOH level <11. - strict I and O's. - NS bolus, b-met.   Rhabdomyolysis - mild <5K.  - IV fluids aggressively.     ARF (acute renal failure) - due to decrease intravacular vol. - CK < 5K, check in am.  Code Status: full Family Communication: none  Disposition Plan: inpatient   Consultants:  none  Procedures:  none  Antibiotics:  none   HPI/Subjective: Pt has no compalins. Able to say when was the last time he use drugs and alcohol.   Objective: Filed Vitals:   07/31/13 0515 07/31/13 0542 07/31/13 0600 07/31/13 0700  BP: 119/64 130/83 127/74 112/73  Pulse:  105 96 103  Temp:   97.5 F (36.4 C)   TempSrc:   Oral   Resp: 23 21 23 22   Height:   5\' 10"  (1.778 m)   Weight:   71.9 kg (158 lb 8.2 oz)   SpO2:  100% 96% 95%    Intake/Output Summary (Last 24 hours) at 07/31/13 0750 Last data filed at 07/31/13 0630  Gross per 24 hour  Intake    250 ml  Output      0 ml  Net    250 ml   Filed Weights   07/31/13 0600  Weight: 71.9 kg (158 lb 8.2 oz)    Exam:  General: Alert, awake, oriented x3, in no acute distress.  HEENT: No bruits, no goiter.  Heart: Regular rate and rhythm, without murmurs, rubs, gallops.  Lungs: Good air movement, clear to auscultation. Abdomen: Soft, nontender, nondistended, positive bowel sounds.  Neuro: Grossly intact, nonfocal.   Data Reviewed: Basic Metabolic Panel:  Recent Labs Lab 07/31/13 0048  NA 137  K 3.2*  CL 95*  CO2 21  GLUCOSE 56*  BUN 22  CREATININE 1.52*  CALCIUM 9.8   Liver Function Tests:  Recent Labs Lab 07/31/13 0048  AST 54*  ALT 25  ALKPHOS 75  BILITOT 2.1*  PROT 7.7  ALBUMIN 4.8   No results found for this basename: LIPASE, AMYLASE,  in the last 168 hours No results found for this basename: AMMONIA,  in the last 168 hours CBC:  Recent Labs Lab  07/31/13 0048  WBC 12.4*  NEUTROABS 9.0*  HGB 16.7  HCT 46.8  MCV 89.0  PLT 207   Cardiac Enzymes:  Recent Labs Lab 07/31/13 0048  CKTOTAL 1841*   BNP (last 3 results) No results found for this basename: PROBNP,  in the last 8760 hours CBG:  Recent Labs Lab 07/31/13 0521  GLUCAP 71    No results found for this or any previous visit (from the past 240 hour(s)).   Studies: No results found.  Scheduled Meds: . potassium chloride  10 mEq Intravenous Q1 Hr x 2  . sodium chloride  3 mL Intravenous Q12H  . thiamine  100 mg Intravenous Daily   Continuous Infusions: . sodium chloride 150 mL/hr at 07/31/13 0600     Kyle Wilkins Rosine Beat  [facility] Pager [phone]. If 8PM-8AM, please contact night-coverage at [email or web address], password [facility] 07/31/2013, 7:50 AM  LOS: 0 days

## 2013-07-31 NOTE — Clinical Social Work Note (Signed)
CSW reviewed chart and spoke with RN. Pt sedated on Ativan currently and unable to be fully assessed. Pt is in police custody per RN. Pt is supervised by [employer]. CSW to follow, but appears the Pt's dispo is to return to jail.   Doreen Salvage, LCSW ICU/Stepdown Clinical Social Worker [facility] Cell [phone] Hours 8am-1200pm M-F

## 2013-07-31 NOTE — Progress Notes (Signed)
CARE MANAGEMENT NOTE 07/31/2013  Patient:  Kyle Wilkins, Kyle Wilkins   Account Number:  [id number]  Date Initiated:  07/31/2013  Documentation initiated by:  Florice Hindle  Subjective/Objective Assessment:   admitted with ams and positive drug screen brought to ed via gpd     Action/Plan:   tbd   Anticipated DC Date:  08/03/2013   Anticipated DC Plan:  [facility] FACILITY  In-house referral  NA      DC Planning Services  NA      PAC Choice  NA   Choice offered to / List presented to:  NA   DME arranged  NA      DME agency  NA     HH arranged  NA      HH agency  NA   Status of service:  In process, will continue to follow Medicare Important Message given?  NA - LOS <3 / Initial given by admissions (If response is "NO", the following Medicare IM given date fields will be blank) Date Medicare IM given:   Date Additional Medicare IM given:    Discharge Disposition:    Per UR Regulation:  Reviewed for med. necessity/level of care/duration of stay  If discussed at Long Length of Stay Meetings, dates discussed:    Comments:  [id number] Earlene Plater RN, BSN, CCN: [phone] Case management. Chart reviewed for discharge planning and present needs. Discharge needs: none present at time of review. Next chart review due:  [id number]

## 2013-08-01 DIAGNOSIS — F1910 Other psychoactive substance abuse, uncomplicated: Secondary | ICD-10-CM

## 2013-08-01 DIAGNOSIS — N289 Disorder of kidney and ureter, unspecified: Secondary | ICD-10-CM

## 2013-08-01 DIAGNOSIS — E876 Hypokalemia: Secondary | ICD-10-CM

## 2013-08-01 LAB — BASIC METABOLIC PANEL
Chloride: 107 mEq/L (ref 96–112)
GFR calc Af Amer: >90 mL/min (ref >90)
GFR calc non Af Amer: >90 mL/min (ref >90)
Potassium: 3 mEq/L — ABNORMAL LOW (ref 3.5–5.1)
Sodium: 138 mEq/L (ref 135–145)

## 2013-08-01 LAB — CK: Total CK: 639 U/L — ABNORMAL HIGH (ref 7–232)

## 2013-08-01 LAB — MAGNESIUM: Magnesium: 1.8 mg/dL (ref 1.5–2.5)

## 2013-08-01 MED ORDER — IPRATROPIUM-ALBUTEROL 20-100 MCG/ACT IN AERS: 1.0000 | INHALATION_SPRAY | Freq: Four times a day (QID) | RESPIRATORY_TRACT | Status: DC | PRN

## 2013-08-01 MED ORDER — POTASSIUM CHLORIDE CRYS ER 20 MEQ PO TBCR: 40.0000 meq | EXTENDED_RELEASE_TABLET | Freq: Two times a day (BID) | ORAL | Status: DC

## 2013-08-01 MED ORDER — POTASSIUM CHLORIDE CRYS ER 20 MEQ PO TBCR
40.0000 meq | EXTENDED_RELEASE_TABLET | ORAL | Status: DC
  Administered 2013-08-01: 40 meq via ORAL
  Filled 2013-08-01 (×3): qty 2

## 2013-08-01 MED ORDER — IPRATROPIUM-ALBUTEROL 20-100 MCG/ACT IN AERS
1.0000 | INHALATION_SPRAY | Freq: Four times a day (QID) | RESPIRATORY_TRACT | Status: DC | PRN
  Filled 2013-08-01: qty 4

## 2013-08-01 NOTE — Discharge Summary (Signed)
Physician Discharge Summary  YIDEL TEUSCHER WUJ:[id number] DOB: 27-Aug-1981 DOA: 07/31/2013  PCP: Default, Provider, MD  Admit date: 07/31/2013 Discharge date: 08/01/2013  Time spent: >30 minutes  Discharge Diagnoses:  Polysubstance abuse Rhabdomyolysis ARF (acute renal failure) Delirium, drug-induced Hypokalemia  Discharge Condition: stable and improved. Outpatient drug counseling and cessation resources to be provided by Child psychotherapist.  Diet recommendation: regular diet  Filed Weights   07/31/13 0600 07/31/13 1845  Weight: 71.9 kg (158 lb 8.2 oz) 74.5 kg (164 lb 3.9 oz)    History of present illness:  32 y.o. male history of asthma was arrested by the police when patient was trying to break in a house. At that time patient looked confused so EMS was called and after which patient was taken to the jail. In the jail patient became more confused and agitated and as per the police officer and ER physician patient had agreed to have taken methamphetamine, Adderall and Vyvanse. One of which he took per rectally. Patient became more agitated and was brought to the ER. Patient's drug screen is positive for amphetamine cocaine benzodiazepine and marijuana. Patient has been admitted for further management. In addition patient was also show renal failure and rhabdomyolysis.   Hospital Course:  1. Acute delirium from multiple drugs secondary to polysubstance abuse  -mentation back to baseline -patient denies SUI, hallucinations or intentional overdose -through Child psychotherapist will arrange outpatient assistance for drug counseling and cessation when he is ready to quit.  2. Mild rhabdomyolysis - resolved with IV hydration. -patient advised to keep himself well hydrated -at discharge CK 639  3. Acute renal failure - secondary to mild rhabdomyolysis and dehydration. -UA negative for infection -renal function back to normal with IVF's -advise to keep himself well hydrated  4. History of asthma  - presently not wheezing. And with good air movement. -continue PRN inhaler  5. Mild leukocytosis - probably reactionary. No fever. -back to WNL after IVF's  6.  Hypokalemia: repleted.   Procedures:  none  Consultations:  none  Discharge Exam: Filed Vitals:   08/01/13 0404  BP: 121/80  Pulse: 107  Temp: 97.6 F (36.4 C)  Resp: 19    General: stable, afebrile, AAOX3 Cardiovascular: slight tachycardia, sinus, no rubs or gallops Respiratory:CTA bilaterally Abd: soft, NT, ND, positive BS Extremities: no edema, cyanosis or clubbing Neuro: non focal  Discharge Instructions  Discharge Orders   Future Orders Complete By Expires     Discharge instructions  As directed     Comments:      Take medications as prescribed Keep yourself well hydrated Stop recreational drugs use Establish care with primary care physician  Follow as recommended with outpatient facilities for drug use counseling and help        Medication List    STOP taking these medications       albuterol 108 (90 BASE) MCG/ACT inhaler  Commonly known as:  PROVENTIL HFA;VENTOLIN HFA      TAKE these medications       Ipratropium-Albuterol 20-100 MCG/ACT Aers respimat  Commonly known as:  COMBIVENT  Inhale 1 puff into the lungs every 6 (six) hours as needed for wheezing or shortness of breath.     potassium chloride SA 20 MEQ tablet  Commonly known as:  K-DUR,KLOR-CON  Take 2 tablets (40 mEq total) by mouth every 12 (twelve) hours.  Start taking on:  08/02/2013       Allergies  Allergen Reactions  . Ceclor (Cefaclor) Other (See Comments)  unknown      The results of significant diagnostics from this hospitalization (including imaging, microbiology, ancillary and laboratory) are listed below for reference.    Significant Diagnostic Studies: No results found.  Microbiology: Recent Results (from the past 240 hour(s))  MRSA PCR SCREENING     Status: None   Collection Time    07/31/13   6:43 AM      Result Value Range Status   MRSA by PCR NEGATIVE  NEGATIVE Final   Comment:            The GeneXpert MRSA Assay (FDA     approved for NASAL specimens     only), is one component of a     comprehensive MRSA colonization     surveillance program. It is not     intended to diagnose MRSA     infection nor to guide or     monitor treatment for     MRSA infections.     Labs: Basic Metabolic Panel:  Recent Labs Lab 07/31/13 0048 07/31/13 0745 08/01/13 0425  NA 137 138 138  K 3.2* 3.6 3.0*  CL 95* 106 107  CO2 21 19 23   GLUCOSE 56* 69* 89  BUN 22 20 10   CREATININE 1.52* 1.10 0.99  CALCIUM 9.8 7.9* 7.9*  MG  --   --  1.8   Liver Function Tests:  Recent Labs Lab 07/31/13 0048 07/31/13 0745  AST 54* 42*  ALT 25 19  ALKPHOS 75 59  BILITOT 2.1* 1.4*  PROT 7.7 5.7*  ALBUMIN 4.8 3.3*   CBC:  Recent Labs Lab 07/31/13 0048 07/31/13 0745  WBC 12.4* 6.9  NEUTROABS 9.0* 4.1  HGB 16.7 14.0  HCT 46.8 40.5  MCV 89.0 90.8  PLT 207 155   Cardiac Enzymes:  Recent Labs Lab 07/31/13 0048 07/31/13 0745 08/01/13 0425  CKTOTAL 1841* 1345* 639*  TROPONINI  --  <0.30  --    CBG:  Recent Labs Lab 07/31/13 1843 07/31/13 1947 07/31/13 2351 08/01/13 0358 08/01/13 0750  GLUCAP 72 74 90 95 95    Signed:  Nitara Szczerba  [facility] 08/01/2013, 10:24 AM

## 2013-08-03 ENCOUNTER — Encounter (HOSPITAL_COMMUNITY): Payer: Self-pay | Admitting: *Deleted

## 2013-08-03 ENCOUNTER — Emergency Department (HOSPITAL_COMMUNITY)
Admission: EM | Admit: 2013-08-03 | Discharge: 2013-08-03 | Disposition: A | Discharge status: Home / Self Care | Attending: Emergency Medicine | Admitting: Emergency Medicine

## 2013-08-03 DIAGNOSIS — F1520 Other stimulant dependence, uncomplicated: Secondary | ICD-10-CM | POA: Insufficient documentation

## 2013-08-03 DIAGNOSIS — F411 Generalized anxiety disorder: Secondary | ICD-10-CM | POA: Insufficient documentation

## 2013-08-03 DIAGNOSIS — J45909 Unspecified asthma, uncomplicated: Secondary | ICD-10-CM | POA: Insufficient documentation

## 2013-08-03 DIAGNOSIS — Z79899 Other long term (current) drug therapy: Secondary | ICD-10-CM | POA: Insufficient documentation

## 2013-08-03 DIAGNOSIS — R000 Tachycardia, unspecified: Secondary | ICD-10-CM | POA: Insufficient documentation

## 2013-08-03 DIAGNOSIS — F17200 Nicotine dependence, unspecified, uncomplicated: Secondary | ICD-10-CM | POA: Insufficient documentation

## 2013-08-03 LAB — COMPREHENSIVE METABOLIC PANEL
Albumin: 4.1 g/dL (ref 3.5–5.2)
BUN: 7 mg/dL (ref 6–23)
Calcium: 9.6 mg/dL (ref 8.4–10.5)
Creatinine, Ser: 0.97 mg/dL (ref 0.50–1.35)
Total Bilirubin: 0.3 mg/dL (ref 0.3–1.2)
Total Protein: 7.1 g/dL (ref 6.0–8.3)

## 2013-08-03 LAB — CBC
HCT: 46.7 % (ref 39.0–52.0)
MCH: 32.6 pg (ref 26.0–34.0)
MCHC: 35.8 g/dL (ref 30.0–36.0)
MCV: 91 fL (ref 78.0–100.0)
RDW: 12.5 % (ref 11.5–15.5)

## 2013-08-03 LAB — ACETAMINOPHEN LEVEL: Acetaminophen (Tylenol), Serum: <15 ug/mL (ref 10–30)

## 2013-08-03 LAB — ETHANOL: Alcohol, Ethyl (B): <11 mg/dL (ref 0–11)

## 2013-08-03 LAB — RAPID URINE DRUG SCREEN, HOSP PERFORMED
Barbiturates: NOT DETECTED
Cocaine: NOT DETECTED
Opiates: NOT DETECTED

## 2013-08-03 MED ORDER — LORAZEPAM 1 MG PO TABS: 2.0000 mg | ORAL_TABLET | ORAL | Status: DC | PRN

## 2013-08-03 MED ORDER — IBUPROFEN 400 MG PO TABS: 600.0000 mg | ORAL_TABLET | Freq: Three times a day (TID) | ORAL | Status: DC | PRN

## 2013-08-03 MED ORDER — NICOTINE 21 MG/24HR TD PT24: 21.0000 mg | MEDICATED_PATCH | Freq: Every day | TRANSDERMAL | Status: DC

## 2013-08-03 MED ORDER — ALUM & MAG HYDROXIDE-SIMETH 200-200-20 MG/5ML PO SUSP: 30.0000 mL | ORAL | Status: DC | PRN

## 2013-08-03 MED ORDER — ONDANSETRON HCL 4 MG PO TABS: 4.0000 mg | ORAL_TABLET | Freq: Three times a day (TID) | ORAL | Status: DC | PRN

## 2013-08-03 MED ORDER — ACETAMINOPHEN 325 MG PO TABS: 650.0000 mg | ORAL_TABLET | ORAL | Status: DC | PRN

## 2013-08-03 NOTE — ED Provider Notes (Signed)
11:13 PM D/c to [facility] for amphetamine abuse treatment. Return precautions given for SI.  1. Amphetamine addiction      Shanna Cisco, MD 08/04/13 [phone]

## 2013-08-03 NOTE — ED Provider Notes (Signed)
CSN: [id number]     Arrival date & time 08/03/13  1554 History     First MD Initiated Contact with Patient 08/03/13 1821     Chief Complaint  Patient presents with  . Medical Clearance   (Consider location/radiation/quality/duration/timing/severity/associated sxs/prior Treatment) The history is provided by the patient and a parent.   Patient here requesting detox from methamphetamines. Denies any other illicit drug use. Admits to occasional alcohol use. No chest pain or chest pressure no shortness of breath. Does feel somewhat jittery. Last use was 3 days ago. Called a rehabilitation facility was told to come in for medical clearance. No abdominal pain no vomiting. No suicidal or homicidal ideations. No treatment used prior to arrival. Past Medical History  Diagnosis Date  . Asthma   . Substance abuse    Past Surgical History  Procedure Laterality Date  . Hernia repair     History reviewed. No pertinent family history. History  Substance Use Topics  . Smoking status: Current Every Day Smoker -- 1.00 packs/day    Types: Cigarettes  . Smokeless tobacco: Never Used  . Alcohol Use: Yes     Comment: binge drinker    Review of Systems  All other systems reviewed and are negative.    Allergies  Ceclor  Home Medications   Current Outpatient Rx  Name  Route  Sig  Dispense  Refill  . Ipratropium-Albuterol (COMBIVENT) 20-100 MCG/ACT AERS respimat   Inhalation   Inhale 1 puff into the lungs every 6 (six) hours as needed for wheezing or shortness of breath.   1 Inhaler   1   . lisdexamfetamine (VYVANSE) 70 MG capsule   Oral   Take 210 mg by mouth every morning.         . potassium chloride SA (K-DUR,KLOR-CON) 20 MEQ tablet   Oral   Take 2 tablets (40 mEq total) by mouth every 12 (twelve) hours.   2 tablet   0    BP 141/88  Pulse 114  Temp(Src) 98.1 F (36.7 C) (Oral)  Resp 20  SpO2 100% Physical Exam  Nursing note and vitals reviewed. Constitutional: He is  oriented to person, place, and time. He appears well-developed and well-nourished.  Non-toxic appearance. No distress.  HENT:  Head: Normocephalic and atraumatic.  Eyes: Conjunctivae, EOM and lids are normal. Pupils are equal, round, and reactive to light.  Neck: Normal range of motion. Neck supple. No tracheal deviation present. No mass present.  Cardiovascular: Regular rhythm and normal heart sounds.  Tachycardia present.  Exam reveals no gallop.   No murmur heard. Pulmonary/Chest: Effort normal and breath sounds normal. No stridor. No respiratory distress. He has no decreased breath sounds. He has no wheezes. He has no rhonchi. He has no rales.  Abdominal: Soft. Normal appearance and bowel sounds are normal. He exhibits no distension. There is no tenderness. There is no rebound and no CVA tenderness.  Musculoskeletal: Normal range of motion. He exhibits no edema and no tenderness.  Neurological: He is alert and oriented to person, place, and time. He has normal strength. No cranial nerve deficit or sensory deficit. GCS eye subscore is 4. GCS verbal subscore is 5. GCS motor subscore is 6.  Skin: Skin is warm and dry. No abrasion and no rash noted.  Psychiatric: His speech is normal and behavior is normal. His mood appears anxious.    ED Course   Procedures (including critical care time)  Labs Reviewed  CBC  COMPREHENSIVE METABOLIC PANEL  ETHANOL  ACETAMINOPHEN LEVEL  SALICYLATE LEVEL  URINE RAPID DRUG SCREEN (HOSP PERFORMED)   No results found. No diagnosis found.  MDM  Patient scheduled for behavior health assessment at 8:00. The nurse did contact [facility] and they're requesting evaluation by the behavior health team. He is now medically cleared and will be fed to address his glucose of 68.  Toy Baker, MD 08/03/13 [phone]

## 2013-08-03 NOTE — ED Notes (Signed)
Patient said he is here to get help for meth use, adderall?, and marijuana.  Patient's last use was Monday.

## 2013-08-03 NOTE — ED Notes (Signed)
Pt was told to come here for medical clearance.  He has been accepted [facility] for detox from meth.
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
## 2013-08-03 NOTE — ED Notes (Signed)
Called [facility] and notified that pt has left ED

## 2013-08-03 NOTE — ED Notes (Signed)
Family arrived to transport pt to [facility]; Brittney berard rn speaking with attending to place pt up for discharge

## 2013-08-03 NOTE — Progress Notes (Signed)
Kyle Wilkins, MHT facilitated placement search for patient who is a 32 year old male who is seeking detox from Methamphetamine and Adderall which he abuses by faxing referral information to [facility]. He reports using Meth IV last used 4 days ago and is experiencing withdrawal symptoms. In efforts to facilitate placement search [facility] was contacted and confirmed that a bed was available for him pending lab results and authorization. Writer faxed lab results and contacted [facility] to obtain authorization. Provided clinical information to [facility] with [facility] to complete enrollment, locus score, DSM-5 diagnosis, ASAM, and treatment goal. Authorization was approved by Kyle Wilkins of [facility] for three days beginning 08/03/13 until 08/05/13 authorization number provided [id number]. Writer informed Kyle Wilkins at [employer] of authorization and Kyle Wilkins reports that patient has been accepted for treatment. Patient has on mode of transportation and will be arriving to facility by his mother and is to provide three days of home medications for his Asthma. Writer informed Brittney, RN of placement and patient is up for discharge.

## 2013-12-11 IMAGING — CR DG KNEE COMPLETE 4+V*L*
4 series · 4 of 4 positions shown · non-contrast
Comparison: No priors.

CLINICAL DATA: History of trauma from a fall complaining of pain in
the medial aspect of the left knee.

LEFT KNEE - COMPLETE 4+ VIEW

[t knee ap left]
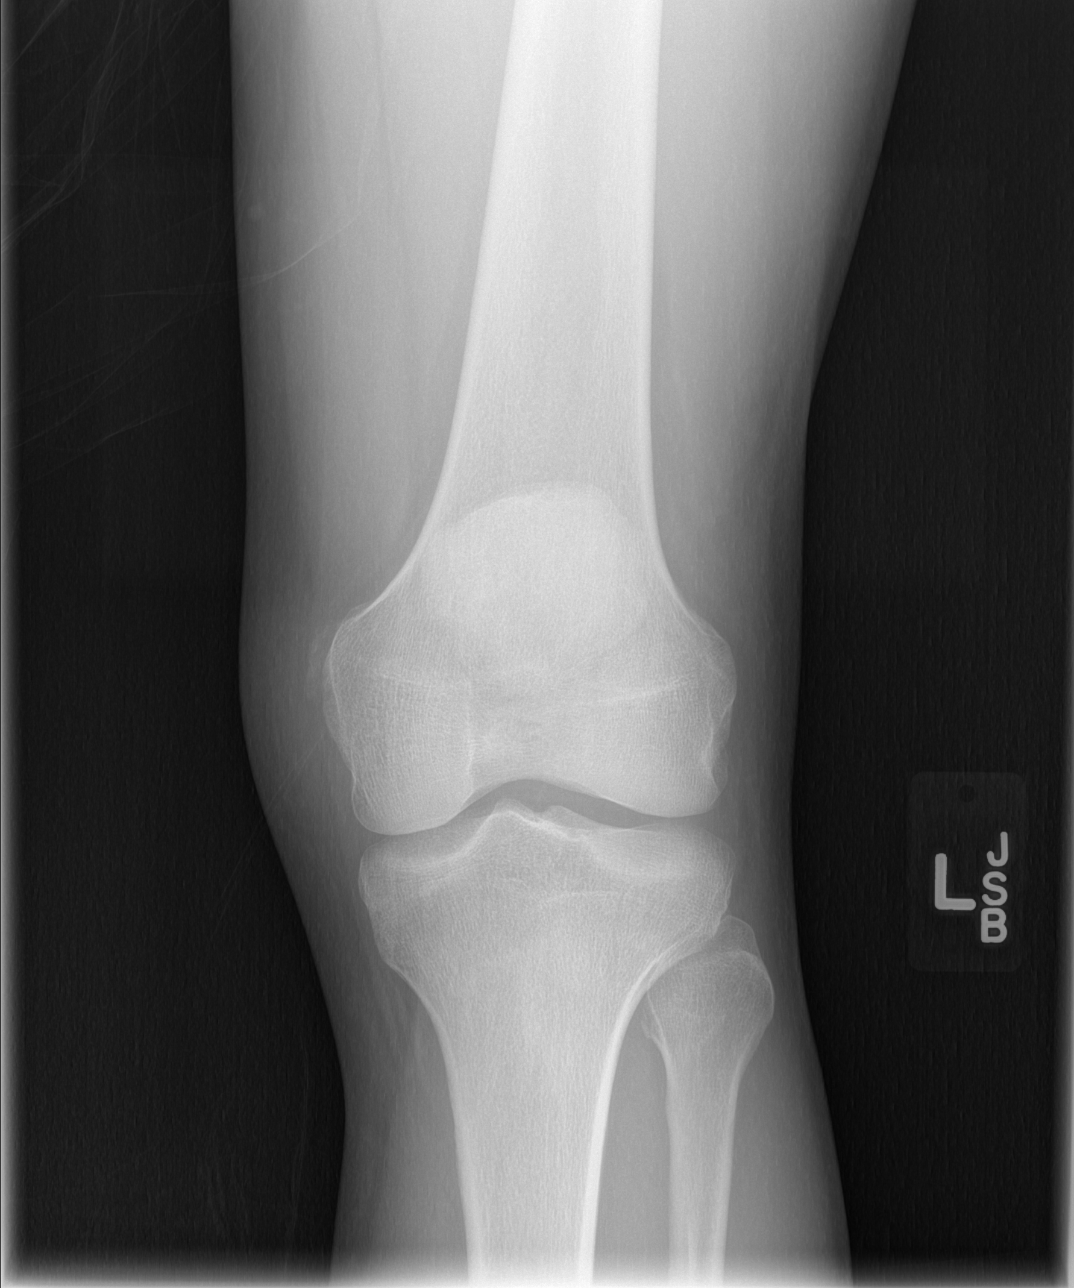

[t knee oblique left (1 of 2)]
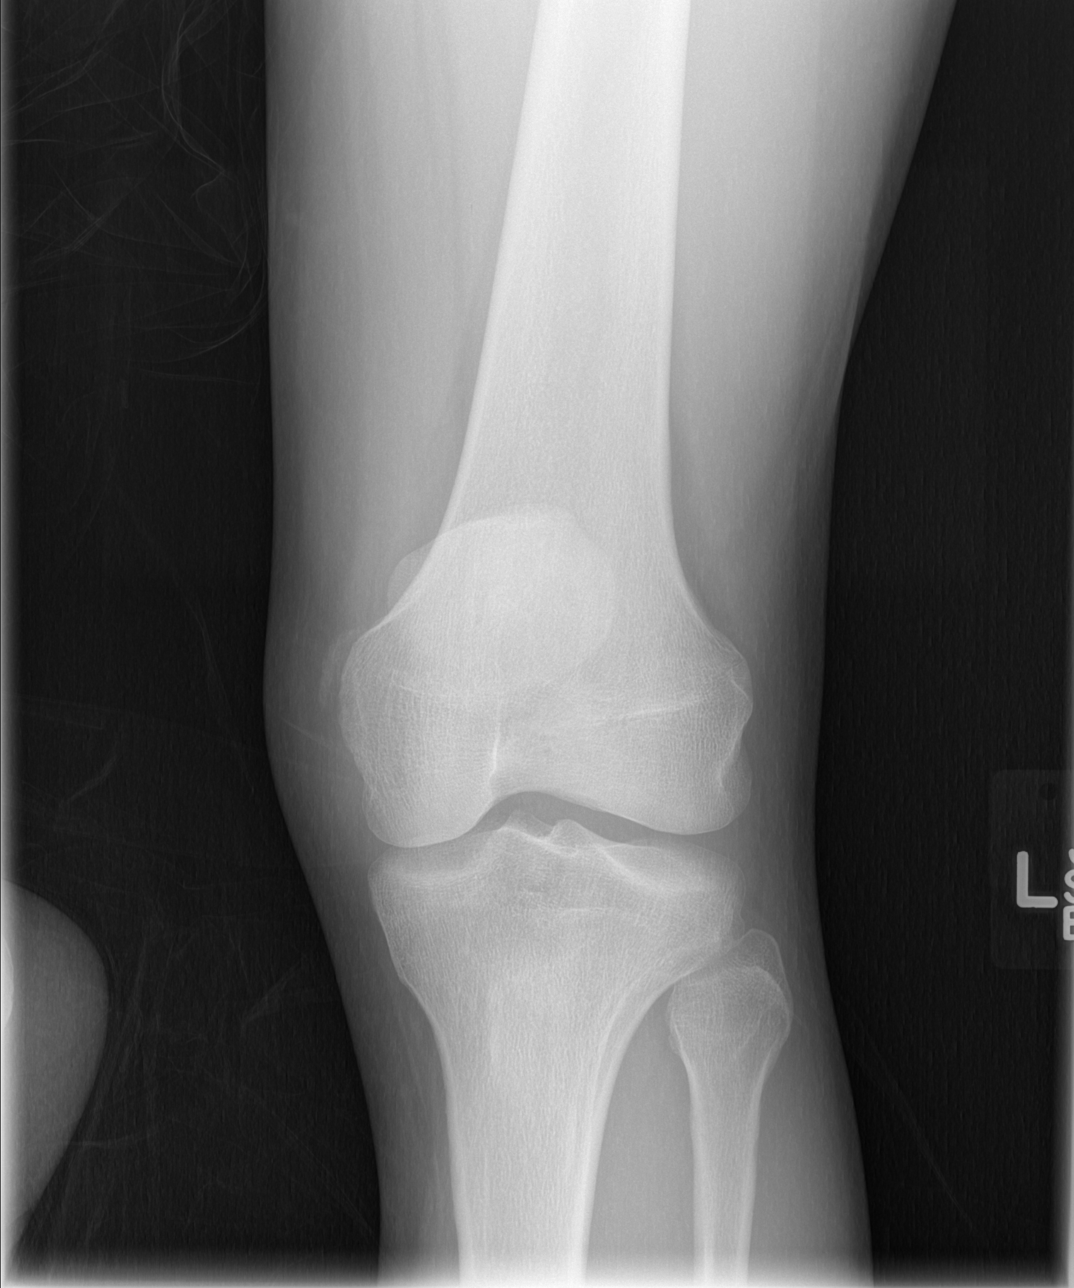

[t knee oblique left (2 of 2)]
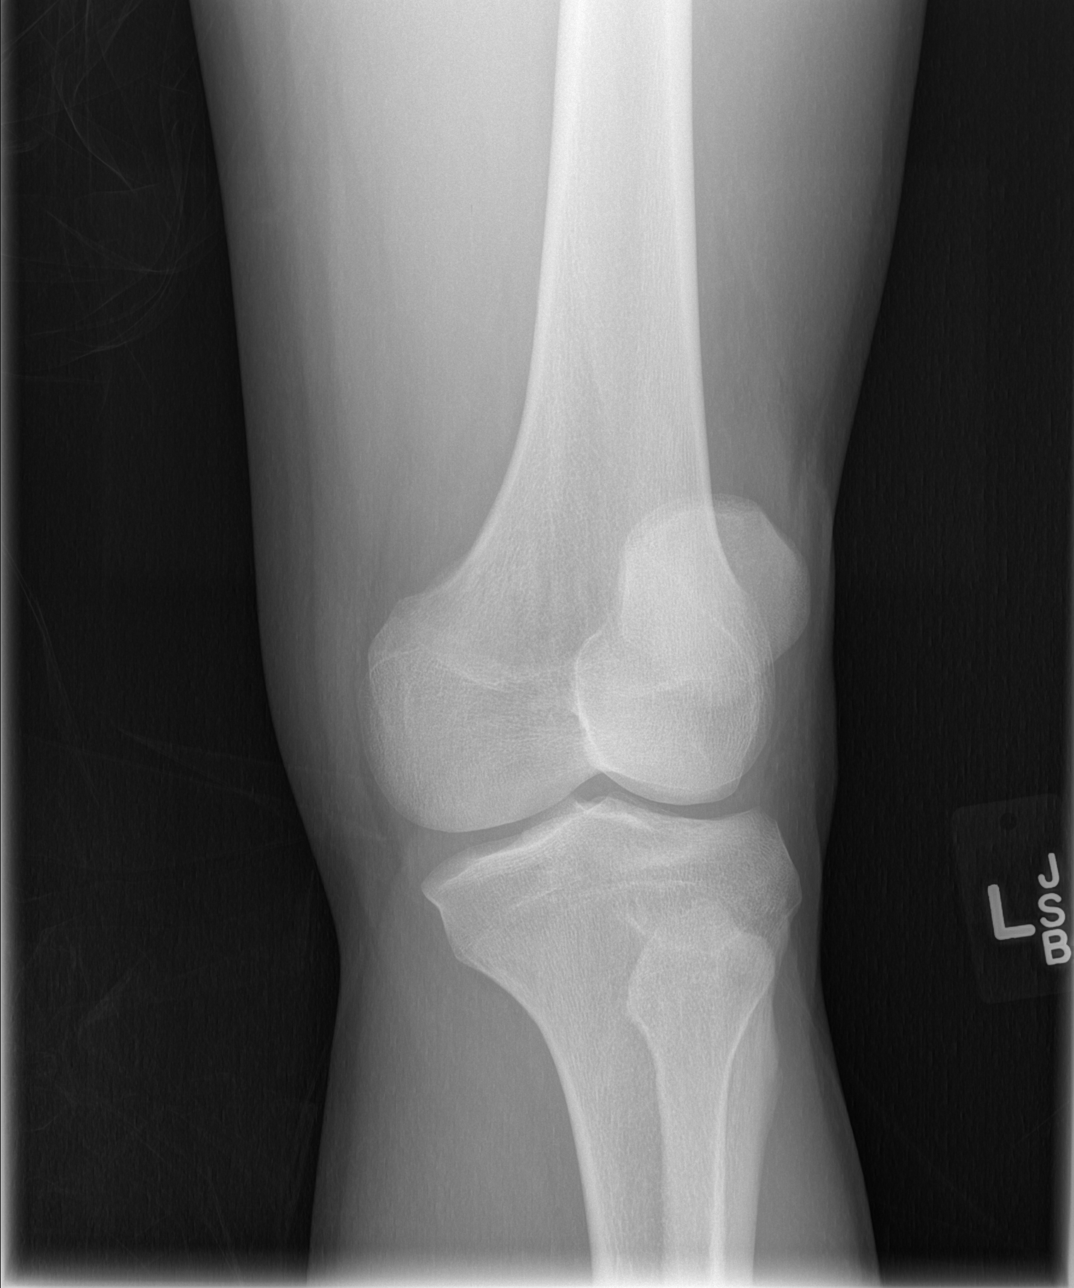

[t knee lat left]
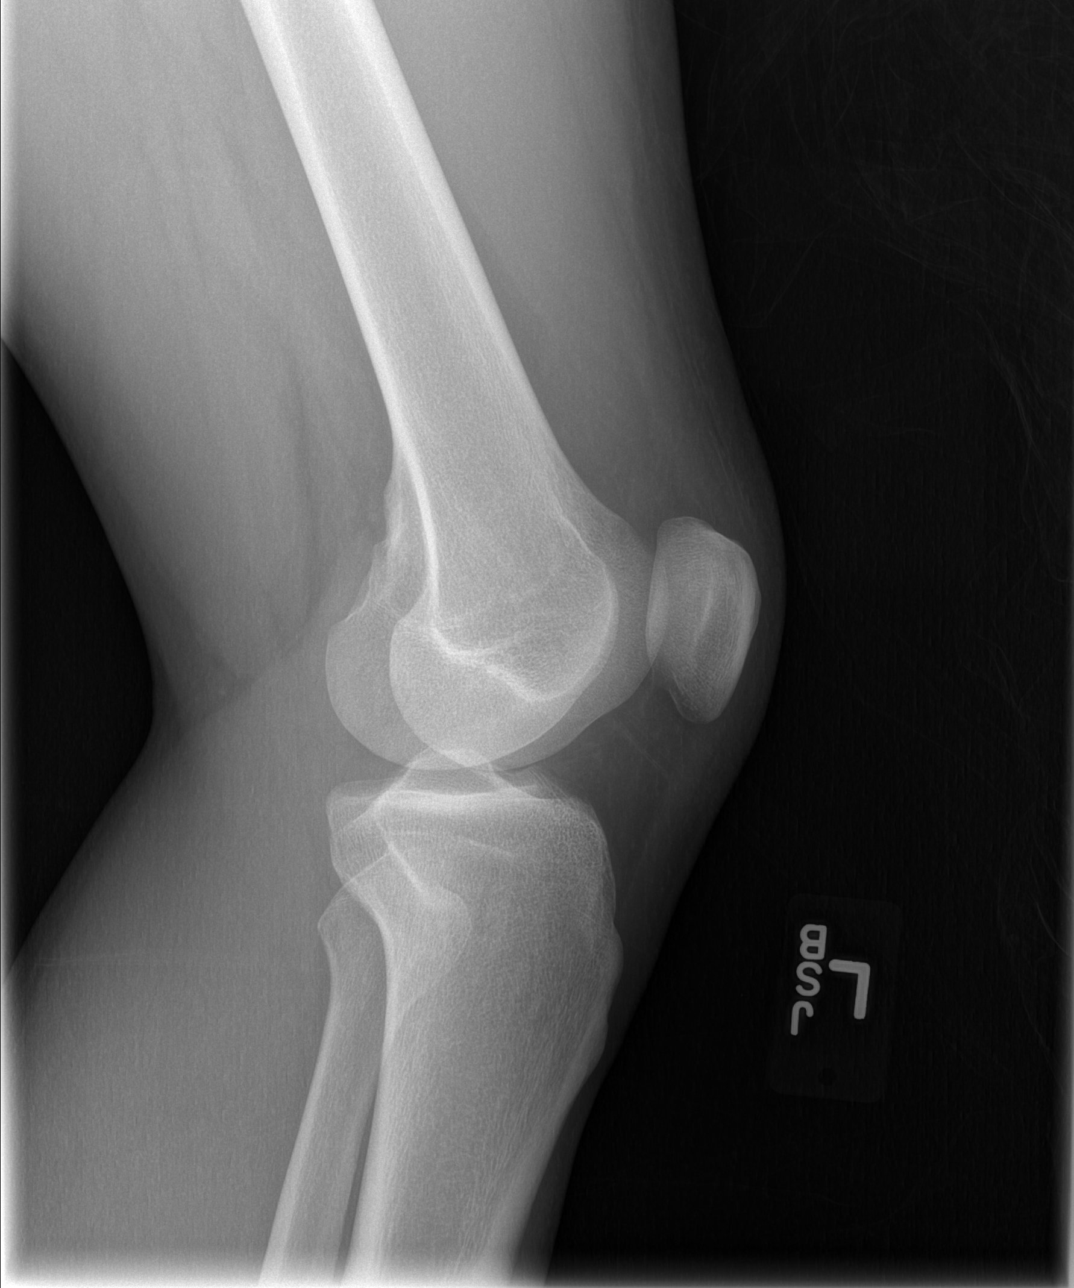

[4 of 4 positions shown; findings below may reference images not displayed]

FINDINGS: Four views of the left knee demonstrate a very subtle
ossific density along the superomedial aspect of the medial femoral
condyle, favored to represent a manifestation of Pelligrini-Stieda
disease.  No joint effusion.  No definite evidence of acute
displaced fracture, subluxation or dislocation.  Soft tissue
swelling medial to the left knee.
IMPRESSION: 1.  Appearance along the superomedial aspect of the medial femoral
condyle is suggestive of early Pelligrini-Stieda disease.
2.  There is a soft tissue swelling medial to the left knee.
However, there is no acute displaced fracture, and no joint
effusion, lipohemarthrosis or other definite findings to suggest an
occult nondisplaced acute fracture.

## 2014-02-27 ENCOUNTER — Emergency Department (HOSPITAL_COMMUNITY)

## 2014-02-27 ENCOUNTER — Emergency Department (HOSPITAL_COMMUNITY)
Admission: EM | Admit: 2014-02-27 | Discharge: 2014-02-27 | Disposition: A | Discharge status: Home / Self Care | Attending: Emergency Medicine | Admitting: Emergency Medicine

## 2014-02-27 ENCOUNTER — Encounter (HOSPITAL_COMMUNITY): Payer: Self-pay | Admitting: Emergency Medicine

## 2014-02-27 DIAGNOSIS — F1510 Other stimulant abuse, uncomplicated: Secondary | ICD-10-CM | POA: Insufficient documentation

## 2014-02-27 DIAGNOSIS — F17200 Nicotine dependence, unspecified, uncomplicated: Secondary | ICD-10-CM | POA: Insufficient documentation

## 2014-02-27 DIAGNOSIS — J45909 Unspecified asthma, uncomplicated: Secondary | ICD-10-CM

## 2014-02-27 DIAGNOSIS — R05 Cough: Secondary | ICD-10-CM

## 2014-02-27 DIAGNOSIS — R61 Generalized hyperhidrosis: Secondary | ICD-10-CM | POA: Insufficient documentation

## 2014-02-27 DIAGNOSIS — R6883 Chills (without fever): Secondary | ICD-10-CM | POA: Insufficient documentation

## 2014-02-27 DIAGNOSIS — Z79899 Other long term (current) drug therapy: Secondary | ICD-10-CM | POA: Insufficient documentation

## 2014-02-27 DIAGNOSIS — J069 Acute upper respiratory infection, unspecified: Secondary | ICD-10-CM | POA: Insufficient documentation

## 2014-02-27 DIAGNOSIS — R059 Cough, unspecified: Secondary | ICD-10-CM

## 2014-02-27 MED ORDER — ALBUTEROL SULFATE HFA 108 (90 BASE) MCG/ACT IN AERS
2.0000 | INHALATION_SPRAY | Freq: Once | RESPIRATORY_TRACT | Status: AC
  Administered 2014-02-27: 2 via RESPIRATORY_TRACT
  Filled 2014-02-27: qty 6.7

## 2014-02-27 MED ORDER — PREDNISONE 20 MG PO TABS: 40.0000 mg | ORAL_TABLET | Freq: Every day | ORAL | Status: DC

## 2014-02-27 MED ORDER — AZITHROMYCIN 250 MG PO TABS: ORAL_TABLET | ORAL | Status: DC

## 2014-02-27 MED ORDER — DEXAMETHASONE SODIUM PHOSPHATE 10 MG/ML IJ SOLN
10.0000 mg | Freq: Once | INTRAMUSCULAR | Status: AC
  Administered 2014-02-27: 10 mg via INTRAMUSCULAR
  Filled 2014-02-27: qty 1

## 2014-02-27 MED ORDER — ALBUTEROL SULFATE (2.5 MG/3ML) 0.083% IN NEBU
5.0000 mg | INHALATION_SOLUTION | Freq: Once | RESPIRATORY_TRACT | Status: AC
  Administered 2014-02-27: 5 mg via RESPIRATORY_TRACT
  Filled 2014-02-27: qty 6

## 2014-02-27 NOTE — ED Notes (Signed)
Pt reports 1 week hx of dry cough. Denies fever. Reports chills. Pt current smoker.

## 2014-02-27 NOTE — ED Provider Notes (Signed)
CSN: [id number]     Arrival date & time 02/27/14  1306 History  This chart was scribed for non-physician practitioner, Arthor Captain, PA-C working with Flint Melter, MD by Greggory Stallion, ED scribe. This patient was seen in room TR08C/TR08C and the patient's care was started at 3:11 PM.   Chief Complaint  Patient presents with  . Cough   The history is provided by the patient. No language interpreter was used.   HPI Comments: Kyle Wilkins is a 33 y.o. male with history of asthma who presents to the Emergency Department complaining of nonproductive cough that started 1.5 weeks ago. He states he has also had chills and diaphoresis. Pt states he's been visiting his grandmother in the nursing home who has pneumonia. Pt states he relapsed this past weekend and used meth for the first time in 10 months. Denies fever. Pt states he ran out of his inhaler.   Past Medical History  Diagnosis Date  . Asthma   . Substance abuse    Past Surgical History  Procedure Laterality Date  . Hernia repair     History reviewed. No pertinent family history. History  Substance Use Topics  . Smoking status: Current Every Day Smoker -- 1.00 packs/day    Types: Cigarettes  . Smokeless tobacco: Never Used  . Alcohol Use: Yes     Comment: binge drinker    Review of Systems  Constitutional: Positive for chills and diaphoresis. Negative for fever.  HENT: Negative for congestion.   Eyes: Negative for redness.  Respiratory: Positive for cough.   Cardiovascular: Negative for chest pain.  Gastrointestinal: Negative for abdominal distention.  Musculoskeletal: Negative for gait problem.  Skin: Negative for rash.  Neurological: Negative for speech difficulty.  Psychiatric/Behavioral: Negative for confusion.   Allergies  Ceclor  Home Medications   Current Outpatient Rx  Name  Route  Sig  Dispense  Refill  . Ipratropium-Albuterol (COMBIVENT) 20-100 MCG/ACT AERS respimat   Inhalation   Inhale 1 puff  into the lungs every 6 (six) hours as needed for wheezing or shortness of breath.   1 Inhaler   1   . lisdexamfetamine (VYVANSE) 70 MG capsule   Oral   Take 210 mg by mouth every morning.         . potassium chloride SA (K-DUR,KLOR-CON) 20 MEQ tablet   Oral   Take 2 tablets (40 mEq total) by mouth every 12 (twelve) hours.   2 tablet   0    BP 134/74  Pulse 104  Temp(Src) 98.5 F (36.9 C) (Oral)  Resp 20  SpO2 96%  Physical Exam  Nursing note and vitals reviewed. Constitutional: He is oriented to person, place, and time. He appears well-developed and well-nourished. No distress.  HENT:  Head: Normocephalic and atraumatic.  Eyes: EOM are normal.  Neck: Neck supple. No tracheal deviation present.  Cardiovascular: Normal rate, regular rhythm and normal heart sounds.   No murmur heard. Pulmonary/Chest: Effort normal and breath sounds normal. No respiratory distress. He has no wheezes. He has no rales.  Musculoskeletal: Normal range of motion.  Neurological: He is alert and oriented to person, place, and time.  Skin: Skin is warm and dry.  Psychiatric: He has a normal mood and affect. His behavior is normal.    ED Course  Procedures (including critical care time)  DIAGNOSTIC STUDIES: Oxygen Saturation is 96% on RA, normal by my interpretation.    COORDINATION OF CARE: 3:15 PM-Discussed treatment plan which includes decadron  in the ED and discharge home with prednisone and a z-pack with pt at bedside and pt agreed to plan. Return precautions given to pt.   Labs Review Labs Reviewed - No data to display Imaging Review Dg Chest 2 View  02/27/2014   CLINICAL DATA Back cough, cold, chills  EXAM CHEST  2 VIEW  COMPARISON DG CHEST 2 VIEW dated 04/19/2012  FINDINGS The heart size and mediastinal contours are within normal limits. Both lungs are clear. The visualized skeletal structures are unremarkable.  IMPRESSION No active cardiopulmonary disease.  SIGNATURE  Electronically  Signed   By: Elige KoHetal  Patel   On: 02/27/2014 14:00     EKG Interpretation None      MDM   Final diagnoses:  Cough  Reactive airway disease    Patient with hx drug abuse and asthma. \ Will cover for infection., suspect rad/bronchospasm.  D/c with zpack, prednisone, albuterol.    I personally performed the services described in this documentation, which was scribed in my presence. The recorded information has been reviewed and is accurate.  Arthor CaptainAbigail Terryl Niziolek, PA-C 02/28/14 [phone]

## 2014-02-27 NOTE — Discharge Instructions (Signed)
Bronchitis Bronchitis is inflammation of the airways that extend from the windpipe into the lungs (bronchi). The inflammation often causes mucus to develop, which leads to a cough. If the inflammation becomes severe, it may cause shortness of breath. CAUSES  Bronchitis may be caused by:   Viral infections.   Bacteria.   Cigarette smoke.   Allergens, pollutants, and other irritants.  SIGNS AND SYMPTOMS  The most common symptom of bronchitis is a frequent cough that produces mucus. Other symptoms include:  Fever.   Body aches.   Chest congestion.   Chills.   Shortness of breath.   Sore throat.  DIAGNOSIS  Bronchitis is usually diagnosed through a medical history and physical exam. Tests, such as chest X-rays, are sometimes done to rule out other conditions.  TREATMENT  You may need to avoid contact with whatever caused the problem (smoking, for example). Medicines are sometimes needed. These may include:  Antibiotics. These may be prescribed if the condition is caused by bacteria.  Cough suppressants. These may be prescribed for relief of cough symptoms.   Inhaled medicines. These may be prescribed to help open your airways and make it easier for you to breathe.   Steroid medicines. These may be prescribed for those with recurrent (chronic) bronchitis. HOME CARE INSTRUCTIONS  Get plenty of rest.   Drink enough fluids to keep your urine clear or pale yellow (unless you have a medical condition that requires fluid restriction). Increasing fluids may help thin your secretions and will prevent dehydration.   Only take over-the-counter or prescription medicines as directed by your health care provider.  Only take antibiotics as directed. Make sure you finish them even if you start to feel better.  Avoid secondhand smoke, irritating chemicals, and strong fumes. These will make bronchitis worse. If you are a smoker, quit smoking. Consider using nicotine gum or  skin patches to help control withdrawal symptoms. Quitting smoking will help your lungs heal faster.   Put a cool-mist humidifier in your bedroom at night to moisten the air. This may help loosen mucus. Change the water in the humidifier daily. You can also run the hot water in your shower and sit in the bathroom with the door closed for 5 10 minutes.   Follow up with your health care provider as directed.   Wash your hands frequently to avoid catching bronchitis again or spreading an infection to others.  SEEK MEDICAL CARE IF: Your symptoms do not improve after 1 week of treatment.  SEEK IMMEDIATE MEDICAL CARE IF:  Your fever increases.  You have chills.   You have chest pain.   You have worsening shortness of breath.   You have bloody sputum.  You faint.  You have lightheadedness.  You have a severe headache.   You vomit repeatedly. MAKE SURE YOU:   Understand these instructions.  Will watch your condition.  Will get help right away if you are not doing well or get worse. Document Released: 12/07/2005 Document Revised: 09/27/2013 Document Reviewed: 08/01/2013 ExitCare Patient Information 2014 [employer]. Bronchospasm, Adult A bronchospasm is a spasm or tightening of the airways going into the lungs. During a bronchospasm breathing becomes more difficult because the airways get smaller. When this happens there can be coughing, a whistling sound when breathing (wheezing), and difficulty breathing. Bronchospasm is often associated with asthma, but not all patients who experience a bronchospasm have asthma. CAUSES  A bronchospasm is caused by inflammation or irritation of the airways. The inflammation or irritation   may be triggered by:   Allergies (such as to animals, pollen, food, or mold). Allergens that cause bronchospasm may cause wheezing immediately after exposure or many hours later.   Infection. Viral infections are believed to be the most common  cause of bronchospasm.   Exercise.   Irritants (such as pollution, cigarette smoke, strong odors, aerosol sprays, and paint fumes).   Weather changes. Winds increase molds and pollens in the air. Rain refreshes the air by washing irritants out. Cold air may cause inflammation.   Stress and emotional upset.  SIGNS AND SYMPTOMS   Wheezing.   Excessive nighttime coughing.   Frequent or severe coughing with a simple cold.   Chest tightness.   Shortness of breath.  DIAGNOSIS  Bronchospasm is usually diagnosed through a history and physical exam. Tests, such as chest X-rays, are sometimes done to look for other conditions. TREATMENT   Inhaled medicines can be given to open up your airways and help you breathe. The medicines can be given using either an inhaler or a nebulizer machine.  Corticosteroid medicines may be given for severe bronchospasm, usually when it is associated with asthma. HOME CARE INSTRUCTIONS   Always have a plan prepared for seeking medical care. Know when to call your health care provider and local emergency services (911 in the [place] Know where you can access local emergency care.  Only take medicines as directed by your health care provider.  If you were prescribed an inhaler or nebulizer machine, ask your health care provider to explain how to use it correctly. Always use a spacer with your inhaler if you were given one.  It is necessary to remain calm during an attack. Try to relax and breathe more slowly.  Control your home environment in the following ways:   Change your heating and air conditioning filter at least once a month.   Limit your use of fireplaces and wood stoves.  Do not smoke and do not allow smoking in your home.   Avoid exposure to perfumes and fragrances.   Get rid of pests (such as roaches and mice) and their droppings.   Throw away plants if you see mold on them.   Keep your house clean and dust free.    Replace carpet with wood, tile, or vinyl flooring. Carpet can trap dander and dust.   Use allergy-proof pillows, mattress covers, and box spring covers.   Wash bed sheets and blankets every week in hot water and dry them in a dryer.   Use blankets that are made of polyester or cotton.   Wash hands frequently. SEEK MEDICAL CARE IF:   You have muscle aches.   You have chest pain.   The sputum changes from clear or white to yellow, green, gray, or bloody.   The sputum you cough up gets thicker.   There are problems that may be related to the medicine you are given, such as a rash, itching, swelling, or trouble breathing.  SEEK IMMEDIATE MEDICAL CARE IF:   You have worsening wheezing and coughing even after taking your prescribed medicines.   You have increased difficulty breathing.   You develop severe chest pain. MAKE SURE YOU:   Understand these instructions.  Will watch your condition.  Will get help right away if you are not doing well or get worse. Document Released: 12/10/2003 Document Revised: 08/09/2013 Document Reviewed: 05/29/2013 ExitCare Patient Information 2014 [employer].  

## 2014-02-27 NOTE — ED Notes (Signed)
Patient transported to X-ray 

## 2014-03-02 NOTE — ED Provider Notes (Signed)
Medical screening examination/treatment/procedure(s) were performed by non-physician practitioner and as supervising physician I was immediately available for consultation/collaboration.   EKG Interpretation None       Flint MelterElliott L Arcadia Gorgas, MD 03/02/14 1026

## 2014-05-01 ENCOUNTER — Emergency Department (HOSPITAL_COMMUNITY)
Admission: EM | Admit: 2014-05-01 | Discharge: 2014-05-02 | Disposition: A | Discharge status: Home / Self Care | Attending: Emergency Medicine | Admitting: Emergency Medicine

## 2014-05-01 ENCOUNTER — Encounter (HOSPITAL_COMMUNITY): Payer: Self-pay | Admitting: Emergency Medicine

## 2014-05-01 DIAGNOSIS — F17200 Nicotine dependence, unspecified, uncomplicated: Secondary | ICD-10-CM | POA: Insufficient documentation

## 2014-05-01 DIAGNOSIS — J45909 Unspecified asthma, uncomplicated: Secondary | ICD-10-CM | POA: Insufficient documentation

## 2014-05-01 DIAGNOSIS — R000 Tachycardia, unspecified: Secondary | ICD-10-CM | POA: Insufficient documentation

## 2014-05-01 DIAGNOSIS — Z79899 Other long term (current) drug therapy: Secondary | ICD-10-CM | POA: Insufficient documentation

## 2014-05-01 DIAGNOSIS — IMO0002 Reserved for concepts with insufficient information to code with codable children: Secondary | ICD-10-CM | POA: Insufficient documentation

## 2014-05-01 DIAGNOSIS — F1510 Other stimulant abuse, uncomplicated: Secondary | ICD-10-CM | POA: Insufficient documentation

## 2014-05-01 DIAGNOSIS — F411 Generalized anxiety disorder: Secondary | ICD-10-CM | POA: Insufficient documentation

## 2014-05-01 MED ORDER — LORAZEPAM 1 MG PO TABS
2.0000 mg | ORAL_TABLET | Freq: Once | ORAL | Status: AC
  Administered 2014-05-01: 2 mg via ORAL
  Filled 2014-05-01: qty 2
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
## 2014-05-02 DIAGNOSIS — F1994 Other psychoactive substance use, unspecified with psychoactive substance-induced mood disorder: Secondary | ICD-10-CM | POA: Diagnosis not present

## 2014-05-02 DIAGNOSIS — F1510 Other stimulant abuse, uncomplicated: Secondary | ICD-10-CM | POA: Diagnosis not present

## 2014-05-02 LAB — BASIC METABOLIC PANEL
BUN: 30 mg/dL — ABNORMAL HIGH (ref 6–23)
CO2: 21 mEq/L (ref 19–32)
Calcium: 9 mg/dL (ref 8.4–10.5)
Chloride: 98 mEq/L (ref 96–112)
Creatinine, Ser: 1.2 mg/dL (ref 0.50–1.35)
GFR calc Af Amer: >90 mL/min (ref >90)
GFR calc non Af Amer: 79 mL/min — ABNORMAL LOW (ref >90)
Glucose, Bld: 95 mg/dL (ref 70–99)
Potassium: 3.4 mEq/L — ABNORMAL LOW (ref 3.7–5.3)
Sodium: 136 mEq/L — ABNORMAL LOW (ref 137–147)

## 2014-05-02 LAB — CBC WITH DIFFERENTIAL/PLATELET
Basophils Absolute: 0 10*3/uL (ref 0.0–0.1)
Basophils Relative: 0 % (ref 0–1)
Eosinophils Absolute: 0.2 10*3/uL (ref 0.0–0.7)
Eosinophils Relative: 3 % (ref 0–5)
HCT: 43.9 % (ref 39.0–52.0)
Hemoglobin: 15.1 g/dL (ref 13.0–17.0)
Lymphocytes Relative: 27 % (ref 12–46)
Lymphs Abs: 2.3 10*3/uL (ref 0.7–4.0)
MCH: 31.3 pg (ref 26.0–34.0)
MCHC: 34.4 g/dL (ref 30.0–36.0)
MCV: 90.9 fL (ref 78.0–100.0)
Monocytes Absolute: 1.1 10*3/uL — ABNORMAL HIGH (ref 0.1–1.0)
Monocytes Relative: 13 % — ABNORMAL HIGH (ref 3–12)
Neutro Abs: 4.7 10*3/uL (ref 1.7–7.7)
Neutrophils Relative %: 56 % (ref 43–77)
Platelets: 194 10*3/uL (ref 150–400)
RBC: 4.83 MIL/uL (ref 4.22–5.81)
RDW: 13.6 % (ref 11.5–15.5)
WBC: 8.3 10*3/uL (ref 4.0–10.5)

## 2014-05-02 LAB — RAPID URINE DRUG SCREEN, HOSP PERFORMED
Amphetamines: POSITIVE — AB
Barbiturates: NOT DETECTED
Benzodiazepines: NOT DETECTED
Cocaine: NOT DETECTED
Opiates: NOT DETECTED
Tetrahydrocannabinol: NOT DETECTED

## 2014-05-02 LAB — ETHANOL: Alcohol, Ethyl (B): <11 mg/dL (ref 0–11)

## 2014-05-02 NOTE — BHH Counselor (Signed)
Clinical research associateWriter provided pt with following resources:  [facility] Int[facility] [place] [facility]    [facility] [address]     [address] [place]     [place], [place] [phone]      [phone] Both a day and evening program   *Accepts MCD  [facility]  [facility] [address]     [address] [place]      [place], [place] [address] [phone]      [phone]  [facility]    [facility] - Intensive Outpatient [address]     [address] [place], [place] [address]     [place], [place]  [phone]      [phone] [address] [place], [place] [address] [phone] *Accepts MCD      Residential Treatment Programs ASAP Residential Treatment    [facility] [address]     [address] [place]      [place], [place] [phone]      [phone] or [phone]  [facility]     [facility] (Several in [place]) [address], [address]    [address] [place] [address]     [place], [place] [phone]      [phone]  [facility]   [facility] ([facility]) [address]     [address] [place], [place] [address]     [place], [place] [phone]      [phone] Admissions: 8am-3pm M-F  Self-Help/Support Groups [facility]   Narcotics Anonymous (NA) Variety of support groups    [facility] [phone] (call for more info)    [address]        [place] - 2 meetings at this location  These referrals have been provided to you as appropriate for your clinical needs while taking into account your financial concerns. Please be aware that agencies, practitioners and insurance companies sometimes change contracts. When  calling to make an appointment have your insurance information available so the professional you are going to see can confirm whether they are covered by your plan. Take this form with you in case the person you are seeing needs a copy or to contact us.  __________________________________________ Assessment Counselor  Evette Cristalaroline Paige Casidy [place], [place]CSWA Assessment Counselor

## 2014-05-02 NOTE — ED Notes (Signed)
Attempted to speak with patient, patient arousable to voice but just jumps and says "huh".  Pt not carrying on conversation at this time.  Spoke with Dr. Adriana [place] about plan of care.  TTS consult ordered.

## 2014-05-02 NOTE — ED Provider Notes (Signed)
CSN: [id number]     Arrival date & time 05/01/14  2134 History   First MD Initiated Contact with Patient 05/01/14 2239     Chief Complaint  Patient presents with  . Hand Problem    numbness     (Consider location/radiation/quality/duration/timing/severity/associated sxs/prior Treatment) HPI Patient presents to the emergency department with methamphetamine withdrawal.  Patient, states, that he has not used methamphetamine since Sunday. patient is very agitated and anxious.  Patient does not answer many questions.  He denies any chest pain, hallucinations suicidality, weakness, dizziness, or syncope.  Patient denies any other drug use or alcohol use Past Medical History  Diagnosis Date  . Asthma   . Substance abuse    Past Surgical History  Procedure Laterality Date  . Hernia repair     No family history on file. History  Substance Use Topics  . Smoking status: Current Every Day Smoker -- 1.00 packs/day    Types: Cigarettes  . Smokeless tobacco: Never Used  . Alcohol Use: Yes     Comment: binge drinker    Review of Systems All other systems negative except as documented in the HPI. All pertinent positives and negatives as reviewed in the HPI.   Allergies  Ceclor  Home Medications   Prior to Admission medications   Medication Sig Start Date End Date Taking? Authorizing Provider  albuterol (PROVENTIL HFA;VENTOLIN HFA) 108 (90 BASE) MCG/ACT inhaler Inhale 1-2 puffs into the lungs every 6 (six) hours as needed for wheezing or shortness of breath.   Yes Historical Provider, MD   BP 143/95  Pulse 120  Temp(Src) 98.4 F (36.9 C) (Oral)  Resp 22  SpO2 96% Physical Exam  Constitutional: He appears well-developed and well-nourished. No distress.  HENT:  Head: Normocephalic and atraumatic.  Mouth/Throat: Oropharynx is clear and moist.  Neck: Normal range of motion. Neck supple.  Cardiovascular: Regular rhythm and normal heart sounds.  Tachycardia present.  Exam reveals no  gallop and no friction rub.   No murmur heard. Pulmonary/Chest: Effort normal and breath sounds normal.  Skin: Skin is warm and dry. No erythema.    ED Course  Procedures (including critical care time) Labs Review Labs Reviewed  CBC WITH DIFFERENTIAL - Abnormal; Notable for the following:    Monocytes Relative 13 (*)    Monocytes Absolute 1.1 (*)    All other components within normal limits  URINE RAPID DRUG SCREEN (HOSP PERFORMED)  ETHANOL  BASIC METABOLIC PANEL    The patient is unclear if he wants detox.  He states he wants to feel better    Carlyle Dollyhristopher W Jacksen Isip, PA-C 05/02/14 [phone]

## 2014-05-02 NOTE — ED Notes (Signed)
Pt alert and oriented x4. Respirations even and unlabored, bilateral symmetrical rise and fall of chest. Skin warm and dry. In no acute distress. Denies needs.   
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
## 2014-05-02 NOTE — ED Notes (Signed)
Pt sleeping very hard.  Responds to pain.  Vitals done.  Will try for drug screen again

## 2014-05-02 NOTE — ED Notes (Signed)
Pt arousable to voice.  Pt able to carry conversation.  Pt reports feeling a little better and still wanting help for detox.  Pt verbalizes understanding of need for urine specimen.

## 2014-05-02 NOTE — BHH Suicide Risk Assessment (Signed)
Suicide Risk Assessment  Discharge Assessment     Demographic Factors:  Male and Adolescent or young adult  Total Time spent with patient: 30 minutes  Psychiatric Specialty Exam:     Blood pressure 132/78, pulse 91, temperature 98.6 F (37 C), temperature source Oral, resp. rate 18, SpO2 96.00%.There is no weight on file to calculate BMI.  General Appearance: Casual  Eye Contact::  Good  Speech:  Clear and Coherent  Volume:  Normal  Mood:  groggy  Affect:  Appropriate  Thought Process:  Coherent and Logical  Orientation:  Full (Time, Place, and Person)  Thought Content:  Negative  Suicidal Thoughts:  No  Homicidal Thoughts:  No  Memory:  Immediate;   Good Recent;   Good Remote;   Good  Judgement:  Intact  Insight:  Fair  Psychomotor Activity:  Normal  Concentration:  Good  Recall:  Good  Fund of Knowledge:Good  Language: Good  Akathisia:  Negative  Handed:  Right  AIMS (if indicated):     Assets:  ArchitectCommunication Skills Financial Resources/Insurance Housing Intimacy Leisure Time Physical Health Social Support Talents/Skills Transportation Vocational/Educational  Sleep:       Musculoskeletal: Strength & Muscle Tone: within normal limits Gait & Station: normal Patient leans: N/A   Mental Status Per Nursing Assessment::   On Admission:     Current Mental Status by Physician: NA  Loss Factors: NA  Historical Factors: NA  Risk Reduction Factors:   NA  Continued Clinical Symptoms:  Alcohol/Substance Abuse/Dependencies  Cognitive Features That Contribute To Risk:  none   Suicide Risk:  Minimal: No identifiable suicidal ideation.  Patients presenting with no risk factors but with morbid ruminations; may be classified as minimal risk based on the severity of the depressive symptoms  Discharge Diagnoses:   AXIS I:  methamphetamine abuse AXIS II:  Deferred AXIS III:   Past Medical History  Diagnosis Date  . Asthma   . Substance abuse     AXIS IV:  problems with chronic substance abuse AXIS V:  61-70 mild symptoms  Plan Of Care/Follow-up recommendations:  Activity:  resume usual activity Diet:  resume usual diet  Is patient on multiple antipsychotic therapies at discharge:  No   Has Patient had three or more failed trials of antipsychotic monotherapy by history:  No  Recommended Plan for Multiple Antipsychotic Therapies: NA    Kyle Wilkins 05/02/2014, 11:29 AM

## 2014-05-02 NOTE — ED Notes (Signed)
Writer asked pt if he was able to urinate, pt sts shook his head and said no.  Writer informed pt that a urine is still needed for a sample

## 2014-05-02 NOTE — Consult Note (Signed)
  Review of Systems  Constitutional: Negative.   HENT: Negative.   Eyes: Negative.   Respiratory: Negative.   Cardiovascular: Negative.   Gastrointestinal: Negative.   Genitourinary: Negative.   Musculoskeletal: Negative.   Skin: Negative.   Neurological: Negative.   Endo/Heme/Allergies: Negative.   Psychiatric/Behavioral: Positive for substance abuse.   Only complaint is numbness in his right hand consistent with the arm he injected

## 2014-05-02 NOTE — ED Notes (Signed)
Patient was resting quietly, but is very startled with any sound or movement. When aroused he is very restless, making rapid and sudden movements.
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
## 2014-05-02 NOTE — Progress Notes (Signed)
[facility] provided pt with a [employer] Orange Card application, highlighting [facility], to help patient establish primary care.  °

## 2014-05-02 NOTE — ED Notes (Signed)
Pt still sleeping at this time.  Visible rise and fall of chest.  No distress noted.

## 2014-05-03 NOTE — ED Provider Notes (Signed)
Medical screening examination/treatment/procedure(s) were conducted as a shared visit with non-physician practitioner(s) and myself.  I personally evaluated the patient during the encounter.   EKG Interpretation None     Patient has history of methamphetamine abuse. Will consult behavioral health. No suicidal or homicidal ideation  Donnetta HutchingBrian Sanford Lindblad, MD 05/03/14 [phone]

## 2014-06-12 ENCOUNTER — Emergency Department (HOSPITAL_COMMUNITY)
Admission: EM | Admit: 2014-06-12 | Discharge: 2014-06-12 | Disposition: A | Discharge status: Home / Self Care | Attending: Emergency Medicine | Admitting: Emergency Medicine

## 2014-06-12 ENCOUNTER — Encounter (HOSPITAL_COMMUNITY): Payer: Self-pay | Admitting: Emergency Medicine

## 2014-06-12 DIAGNOSIS — F4322 Adjustment disorder with anxiety: Secondary | ICD-10-CM

## 2014-06-12 DIAGNOSIS — R45851 Suicidal ideations: Secondary | ICD-10-CM | POA: Insufficient documentation

## 2014-06-12 DIAGNOSIS — F1510 Other stimulant abuse, uncomplicated: Secondary | ICD-10-CM | POA: Insufficient documentation

## 2014-06-12 DIAGNOSIS — F17200 Nicotine dependence, unspecified, uncomplicated: Secondary | ICD-10-CM | POA: Insufficient documentation

## 2014-06-12 DIAGNOSIS — F1210 Cannabis abuse, uncomplicated: Secondary | ICD-10-CM | POA: Insufficient documentation

## 2014-06-12 DIAGNOSIS — Z79899 Other long term (current) drug therapy: Secondary | ICD-10-CM | POA: Insufficient documentation

## 2014-06-12 DIAGNOSIS — F1910 Other psychoactive substance abuse, uncomplicated: Secondary | ICD-10-CM

## 2014-06-12 DIAGNOSIS — J45909 Unspecified asthma, uncomplicated: Secondary | ICD-10-CM | POA: Insufficient documentation

## 2014-06-12 LAB — CBC WITH DIFFERENTIAL/PLATELET
BASOS PCT: 0 % (ref 0–1)
Basophils Absolute: 0 10*3/uL (ref 0.0–0.1)
Eosinophils Absolute: 0.1 10*3/uL (ref 0.0–0.7)
Eosinophils Relative: 1 % (ref 0–5)
HCT: 48.3 % (ref 39.0–52.0)
Hemoglobin: 17 g/dL (ref 13.0–17.0)
Lymphocytes Relative: 22 % (ref 12–46)
Lymphs Abs: 2.7 10*3/uL (ref 0.7–4.0)
MCH: 31.4 pg (ref 26.0–34.0)
MCHC: 35.2 g/dL (ref 30.0–36.0)
MCV: 89.1 fL (ref 78.0–100.0)
Monocytes Absolute: 1.6 10*3/uL — ABNORMAL HIGH (ref 0.1–1.0)
Monocytes Relative: 13 % — ABNORMAL HIGH (ref 3–12)
NEUTROS PCT: 64 % (ref 43–77)
Neutro Abs: 7.9 10*3/uL — ABNORMAL HIGH (ref 1.7–7.7)
PLATELETS: 258 10*3/uL (ref 150–400)
RBC: 5.42 MIL/uL (ref 4.22–5.81)
RDW: 13.2 % (ref 11.5–15.5)
WBC: 12.2 10*3/uL — ABNORMAL HIGH (ref 4.0–10.5)

## 2014-06-12 LAB — I-STAT CHEM 8, ED
BUN: 19 mg/dL (ref 6–23)
Calcium, Ion: 1.09 mmol/L — ABNORMAL LOW (ref 1.12–1.23)
Chloride: 102 mEq/L (ref 96–112)
Creatinine, Ser: 1.4 mg/dL — ABNORMAL HIGH (ref 0.50–1.35)
GLUCOSE: 91 mg/dL (ref 70–99)
HEMATOCRIT: 54 % — AB (ref 39.0–52.0)
HEMOGLOBIN: 18.4 g/dL — AB (ref 13.0–17.0)
POTASSIUM: 4.8 meq/L (ref 3.7–5.3)
SODIUM: 139 meq/L (ref 137–147)
TCO2: 25 mmol/L (ref 0–100)

## 2014-06-12 LAB — SALICYLATE LEVEL

## 2014-06-12 LAB — RAPID URINE DRUG SCREEN, HOSP PERFORMED
Amphetamines: POSITIVE — AB
Barbiturates: NOT DETECTED
Benzodiazepines: NOT DETECTED
Cocaine: POSITIVE — AB
OPIATES: NOT DETECTED
TETRAHYDROCANNABINOL: POSITIVE — AB

## 2014-06-12 LAB — ACETAMINOPHEN LEVEL

## 2014-06-12 LAB — ETHANOL: Alcohol, Ethyl (B): <11 mg/dL (ref 0–11)

## 2014-06-12 MED ORDER — ONDANSETRON HCL 4 MG PO TABS: 4.0000 mg | ORAL_TABLET | Freq: Three times a day (TID) | ORAL | Status: DC | PRN

## 2014-06-12 MED ORDER — ACETAMINOPHEN 325 MG PO TABS: 650.0000 mg | ORAL_TABLET | ORAL | Status: DC | PRN

## 2014-06-12 MED ORDER — LORAZEPAM 1 MG PO TABS: 1.0000 mg | ORAL_TABLET | Freq: Three times a day (TID) | ORAL | Status: DC | PRN

## 2014-06-12 MED ORDER — IBUPROFEN 200 MG PO TABS: 600.0000 mg | ORAL_TABLET | Freq: Three times a day (TID) | ORAL | Status: DC | PRN

## 2014-06-12 MED ORDER — SODIUM CHLORIDE 0.9 % IV BOLUS (SEPSIS)
1000.0000 mL | Freq: Once | INTRAVENOUS | Status: AC
  Administered 2014-06-12: 1000 mL via INTRAVENOUS

## 2014-06-12 MED ORDER — ALUM & MAG HYDROXIDE-SIMETH 200-200-20 MG/5ML PO SUSP: 30.0000 mL | ORAL | Status: DC | PRN

## 2014-06-12 MED ORDER — LORAZEPAM 2 MG/ML IJ SOLN
1.0000 mg | Freq: Once | INTRAMUSCULAR | Status: AC
  Administered 2014-06-12: 1 mg via INTRAVENOUS
  Filled 2014-06-12: qty 1

## 2014-06-12 NOTE — ED Notes (Signed)
Pt reports numbness in right arm.  Pt states, "I think they put 'Anal ease" into my arm."

## 2014-06-12 NOTE — Discharge Instructions (Signed)
Polysubstance Abuse °When people abuse more than one drug or type of drug it is called polysubstance or polydrug abuse. For example, many smokers also drink alcohol. This is one form of polydrug abuse. Polydrug abuse also refers to the use of a drug to counteract an unpleasant effect produced by another drug. It may also be used to help with withdrawal from another drug. People who take stimulants may become agitated. Sometimes this agitation is countered with a tranquilizer. This helps protect against the unpleasant side effects. Polydrug abuse also refers to the use of different drugs at the same time.  °Anytime drug use is interfering with normal living activities, it has become abuse. This includes problems with family and friends. Psychological dependence has developed when your mind tells you that the drug is needed. This is usually followed by physical dependence which has developed when continuing increases of drug are required to get the same feeling or "high". This is known as addiction or chemical dependency. A person's risk is much higher if there is a history of chemical dependency in the family. °SIGNS OF CHEMICAL DEPENDENCY °· You have been told by friends or family that drugs have become a problem. °· You fight when using drugs. °· You are having blackouts (not remembering what you do while using). °· You feel sick from using drugs but continue using. °· You lie about use or amounts of drugs (chemicals) used. °· You need chemicals to get you going. °· You are suffering in work performance or in school because of drug use. °· You get sick from use of drugs but continue to use anyway. °· You need drugs to relate to people or feel comfortable in social situations. °· You use drugs to forget problems. °"Yes" answered to any of the above signs of chemical dependency indicates there are problems. The longer the use of drugs continues, the greater the problems will become. °If there is a family history of  drug or alcohol use, it is best not to experiment with these drugs. Continual use leads to tolerance. After tolerance develops more of the drug is needed to get the same feeling. This is followed by addiction. With addiction, drugs become the most important part of life. It becomes more important to take drugs than participate in the other usual activities of life. This includes relating to friends and family. Addiction is followed by dependency. Dependency is a condition where drugs are now needed not just to get high, but to feel normal. °Addiction cannot be cured but it can be stopped. This often requires outside help and the care of professionals. Treatment centers are listed in the yellow pages under: Cocaine, Narcotics, and Alcoholics Anonymous. Most hospitals and clinics can refer you to a specialized care center. Talk to your caregiver if you need help. °Document Released: 07/29/2005 Document Revised: 02/29/2012 Document Reviewed: 12/07/2005 °ExitCare® Patient Information ©2015 [employer]. This information is not intended to replace advice given to you by your health care provider. Make sure you discuss any questions you have with your health care provider. ° °

## 2014-06-12 NOTE — ED Notes (Signed)
Pt easily arousable. Pt reports he has been bingeing on meth since Saturday night. Sts he has used before but can go a few months without using. Last use 3 weeks ago. Sts he "felt lonely and it was just something to do." Denies SI/HI prior to using. Sts while using he "went to a really bad place that I never want to go to again, felt like they (3 people) were trying to kill me." Denies SI/HI now. Sts it was just the meth that made him SI because he thought he would kill himself before they killed him. Pt upset during assessment stating he does not want to be "a crack head on the street." Pt reminded that he was catheterized 2/2 urinary retention last night. Encouraged to try to use urinal. If unable, will bladder scan.

## 2014-06-12 NOTE — Consult Note (Signed)
  Review of Systems  Constitutional: Negative.   HENT: Negative.   Eyes: Negative.   Respiratory: Negative.   Cardiovascular: Negative.   Gastrointestinal: Negative.   Genitourinary: Negative.   Musculoskeletal: Negative.   Skin: Negative.   Neurological: Negative.   Psychiatric/Behavioral: Positive for substance abuse.

## 2014-06-12 NOTE — BH Assessment (Signed)
Per Dr. Ladona Ridgelaylor, patient to discharge home once medically cleared. No SI, HI, and AVH's. No further psychiatric clearance needed. Writer provided patient with a list of follow up referrals. Patient's nurse made aware.

## 2014-06-12 NOTE — ED Notes (Signed)
Bed: WTR7 Expected date:  Expected time:  Means of arrival:  Comments: 

## 2014-06-12 NOTE — ED Provider Notes (Signed)
12:30- he was evaluated overnight after ingesting crystal, possibly tainted with GHB. He was anxious and tearful. He feels better now. He wants to go to an appointment [facility] the [employer] [facility].  Exam- alert, calm, cooperative. No respiratory distress. No focal neurologic abnormalities.    Flint MelterElliott L Wentz, MD 06/12/14 [phone]

## 2014-06-12 NOTE — ED Notes (Signed)
Pt did not have a RR of 0 at 03:00.  Leads were not reading correctly.

## 2014-06-12 NOTE — Consult Note (Signed)
[facility]   Reason for Consult:  Bad trip on crystal meth Referring Physician:  ER MD  Kyle Wilkins is an 33 y.o. male. 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 Asthma   . Substance abuse    AXIS IV:  misuse of illicit substances AXIS V:  51-60 moderate symptoms  Plan:  No evidence of imminent risk to self or others at present.    Subjective:   Kyle Wilkins is a 33 y.o. male patient admitted with bad trip on crystal meth.  HPI:  Mr Kyle Wilkins says he has been taking crystal meth recreationally.  Yesterday he took more than he usually takes and believes it was laced with something else as he had a very bad reaction to the point of feeling people were trying to kill him and also thoughts of wanting to kill himself.  He is not suicidal today but is still a bit hazy.  He is not sure about people wanting to hurt him but is aware that this comes from his bad experience with the meth. HPI Elements:   Location:  bad experience with methamphetamine. Quality:  paranoid and suicidal. Severity:  as above. Timing:  took twice as much meth as usual and believes it was laced with something else. Duration:  uses every now and thing. Context:  as above.  Past Psychiatric History: Past Medical History  Diagnosis Date  . Asthma   . Substance abuse     reports that he has been smoking Cigarettes.  He has been smoking about 1.00 pack per day. He has never used smokeless tobacco. He reports that he drinks alcohol. He reports that he uses illicit drugs (Methamphetamines). No family history on file.         Allergies:   Allergies  Allergen Reactions  . Ceclor [Cefaclor] Rash    ACT Assessment Complete:  Yes:    Educational Status    Risk to Self: Risk to self Is patient at risk for suicide?: Yes Substance abuse  history and/or treatment for substance abuse?: Yes  Risk to Others:    Abuse:    Prior Inpatient Therapy:    Prior Outpatient Therapy:    Additional Information:                    Objective: Blood pressure 140/95, pulse 102, temperature 97.4 F (36.3 C), temperature source Oral, resp. rate 15, SpO2 99.00%.There is no weight on file to calculate BMI. Results for orders placed during the hospital encounter of 06/12/14 (from the past 72 hour(s))  CBC WITH DIFFERENTIAL     Status: Abnormal   Collection Time    06/12/14  2:17 AM      Result Value Ref Range   WBC 12.2 (*) 4.0 - 10.5 K/uL   RBC 5.42  4.22 - 5.81 MIL/uL   Hemoglobin 17.0  13.0 - 17.0 g/dL   HCT 16.148.3  09.639.0 - 04.552.0 %   MCV 89.1  78.0 - 100.0 fL   MCH 31.4  26.0 - 34.0 pg   MCHC 35.2  30.0 - 36.0 g/dL   RDW 40.913.2  81.111.5 - 91.415.5 %   Platelets 258  150 - 400 K/uL   Neutrophils Relative % 64  43 - 77 %   Neutro Abs 7.9 (*) 1.7 -  7.7 K/uL   Lymphocytes Relative 22  12 - 46 %   Lymphs Abs 2.7  0.7 - 4.0 K/uL   Monocytes Relative 13 (*) 3 - 12 %   Monocytes Absolute 1.6 (*) 0.1 - 1.0 K/uL   Eosinophils Relative 1  0 - 5 %   Eosinophils Absolute 0.1  0.0 - 0.7 K/uL   Basophils Relative 0  0 - 1 %   Basophils Absolute 0.0  0.0 - 0.1 K/uL  ACETAMINOPHEN LEVEL     Status: None   Collection Time    06/12/14  2:17 AM      Result Value Ref Range   Acetaminophen (Tylenol), Serum <15.0  10 - 30 ug/mL   Comment:            THERAPEUTIC CONCENTRATIONS VARY     SIGNIFICANTLY. A RANGE OF 10-30     ug/mL MAY BE AN EFFECTIVE     CONCENTRATION FOR MANY PATIENTS.     HOWEVER, SOME ARE BEST TREATED     AT CONCENTRATIONS OUTSIDE THIS     RANGE.     ACETAMINOPHEN CONCENTRATIONS     >150 ug/mL AT 4 HOURS AFTER     INGESTION AND >50 ug/mL AT 12     HOURS AFTER INGESTION ARE     OFTEN ASSOCIATED WITH TOXIC     REACTIONS.  SALICYLATE LEVEL     Status: Abnormal   Collection Time    06/12/14  2:17 AM      Result Value Ref  Range   Salicylate Lvl <2.0 (*) 2.8 - 20.0 mg/dL  ETHANOL     Status: None   Collection Time    06/12/14  2:17 AM      Result Value Ref Range   Alcohol, Ethyl (B) <11  0 - 11 mg/dL   Comment:            LOWEST DETECTABLE LIMIT FOR     SERUM ALCOHOL IS 11 mg/dL     FOR MEDICAL PURPOSES ONLY  I-STAT CHEM 8, ED     Status: Abnormal   Collection Time    06/12/14  2:25 AM      Result Value Ref Range   Sodium 139  137 - 147 mEq/L   Potassium 4.8  3.7 - 5.3 mEq/L   Chloride 102  96 - 112 mEq/L   BUN 19  6 - 23 mg/dL   Creatinine, Ser 9.601.40 (*) 0.50 - 1.35 mg/dL   Glucose, Bld 91  70 - 99 mg/dL   Calcium, Ion 4.541.09 (*) 1.12 - 1.23 mmol/L   TCO2 25  0 - 100 mmol/L   Hemoglobin 18.4 (*) 13.0 - 17.0 g/dL   HCT 09.854.0 (*) 11.939.0 - 14.752.0 %  URINE RAPID DRUG SCREEN (HOSP PERFORMED)     Status: Abnormal   Collection Time    06/12/14  4:26 AM      Result Value Ref Range   Opiates NONE DETECTED  NONE DETECTED   Cocaine POSITIVE (*) NONE DETECTED   Benzodiazepines NONE DETECTED  NONE DETECTED   Amphetamines POSITIVE (*) NONE DETECTED   Tetrahydrocannabinol POSITIVE (*) NONE DETECTED   Barbiturates NONE DETECTED  NONE DETECTED   Comment:            DRUG SCREEN FOR MEDICAL PURPOSES     ONLY.  IF CONFIRMATION IS NEEDED     FOR ANY PURPOSE, NOTIFY LAB     WITHIN 5 DAYS.  LOWEST DETECTABLE LIMITS     FOR URINE DRUG SCREEN     Drug Class       Cutoff (ng/mL)     Amphetamine      1000     Barbiturate      200     Benzodiazepine   200     Tricyclics       300     Opiates          300     Cocaine          300     THC              50   Labs are reviewed and are pertinent for cocaine, amphetamines and marijuana.  Current Facility-Administered Medications  Medication Dose Route Frequency Provider Last Rate Last Dose  . acetaminophen (TYLENOL) tablet 650 mg  650 mg Oral Q4H PRN April K Palumbo-Rasch, MD      . alum & mag hydroxide-simeth (MAALOX/MYLANTA) 200-200-20 MG/5ML suspension  30 mL  30 mL Oral PRN April K Palumbo-Rasch, MD      . ibuprofen (ADVIL,MOTRIN) tablet 600 mg  600 mg Oral Q8H PRN April K Palumbo-Rasch, MD      . LORazepam (ATIVAN) tablet 1 mg  1 mg Oral Q8H PRN April K Palumbo-Rasch, MD      . ondansetron [facility]) tablet 4 mg  4 mg Oral Q8H PRN April Smitty Cords, MD       Current Outpatient Prescriptions  Medication Sig Dispense Refill  . albuterol (PROVENTIL HFA;VENTOLIN HFA) 108 (90 BASE) MCG/ACT inhaler Inhale 1-2 puffs into the lungs every 6 (six) hours as needed for wheezing or shortness of breath.        Psychiatric Specialty Exam:     Blood pressure 140/95, pulse 102, temperature 97.4 F (36.3 C), temperature source Oral, resp. rate 15, SpO2 99.00%.There is no weight on file to calculate BMI.  General Appearance: Casual  Eye Contact::  Good  Speech:  Clear and Coherent  Volume:  Normal  Mood:  Anxious  Affect:  Congruent  Thought Process:  Coherent and Logical  Orientation:  Full (Time, Place, and Person)  Thought Content:  Negative  Suicidal Thoughts:  No  Homicidal Thoughts:  No  Memory:  Immediate;   Good Recent;   Good Remote;   Good  Judgement:  Intact  Insight:  Fair  Psychomotor Activity:  Normal  Concentration:  Good  Recall:  Good  Fund of Knowledge:Good  Language: Good  Akathisia:  Negative  Handed:  Right  AIMS (if indicated):     Assets:  Communication Skills Desire for Improvement Financial Resources/Insurance Housing Intimacy Talents/Skills Transportation Vocational/Educational  Sleep:      Musculoskeletal: Strength & Muscle Tone: within normal limits Gait & Station: normal Patient leans: N/A  Treatment Plan Summary: no psychiatric criteria to retain him, seems to be a bad reaction to drugs.  Discharge when medically cleared  TAYLOR,GERALD D 06/12/2014 12:33 PM

## 2014-06-12 NOTE — ED Notes (Signed)
Pt presents with c/o overdose. Pt says that since this afternoon he has been using crystal meth that may have been laced with GHB. Pt is also very anxious and tearful at this time. Pt says that he has been having thoughts of slitting his throat with a knife, pt admits to being an addict.

## 2014-06-12 NOTE — Progress Notes (Signed)
[facility] provided pt with a [employer] Orange Card application, highlighting [facility], to help patient establish primary care.  °

## 2014-06-12 NOTE — ED Provider Notes (Signed)
CSN: [id number]     Arrival date & time 06/12/14  0131 History   First MD Initiated Contact with Patient 06/12/14 0211     Chief Complaint  Patient presents with  . Drug Overdose     (Consider location/radiation/quality/duration/timing/severity/associated sxs/prior Treatment) Patient is a 33 y.o. male presenting with Overdose. The history is provided by the patient. No language interpreter was used.  Drug Overdose This is a new problem. The current episode started 12 to 24 hours ago. The problem occurs constantly. The problem has not changed since onset.Pertinent negatives include no chest pain, no abdominal pain, no headaches and no shortness of breath. Nothing aggravates the symptoms. Nothing relieves the symptoms. He has tried nothing for the symptoms. The treatment provided no relief.    Past Medical History  Diagnosis Date  . Asthma   . Substance abuse    Past Surgical History  Procedure Laterality Date  . Hernia repair     No family history on file. History  Substance Use Topics  . Smoking status: Current Every Day Smoker -- 1.00 packs/day    Types: Cigarettes  . Smokeless tobacco: Never Used  . Alcohol Use: Yes     Comment: binge drinker    Review of Systems  Respiratory: Negative for shortness of breath.   Cardiovascular: Negative for chest pain.  Gastrointestinal: Negative for abdominal pain.  Neurological: Negative for headaches.  Psychiatric/Behavioral: Positive for suicidal ideas. Negative for self-injury and decreased concentration.  All other systems reviewed and are negative.     Allergies  Ceclor  Home Medications   Prior to Admission medications   Medication Sig Start Date End Date Taking? Authorizing Provider  albuterol (PROVENTIL HFA;VENTOLIN HFA) 108 (90 BASE) MCG/ACT inhaler Inhale 1-2 puffs into the lungs every 6 (six) hours as needed for wheezing or shortness of breath.    Historical Provider, MD   BP 170/92  Pulse 114  Temp(Src) 98.9 F  (37.2 C) (Oral)  Resp 21  SpO2 99% Physical Exam  Constitutional: He is oriented to person, place, and time. He appears well-developed and well-nourished. No distress.  HENT:  Head: Normocephalic and atraumatic.  Mouth/Throat: Oropharynx is clear and moist.  Eyes: Conjunctivae are normal. Pupils are equal, round, and reactive to light.  Neck: Normal range of motion. Neck supple.  Cardiovascular: Normal rate, regular rhythm and intact distal pulses.   Pulmonary/Chest: Effort normal and breath sounds normal. He has no wheezes. He has no rales.  Abdominal: Soft. Bowel sounds are normal. There is no tenderness. There is no rebound and no guarding.  Musculoskeletal: Normal range of motion.  Neurological: He is alert and oriented to person, place, and time.  Skin: Skin is warm and dry.  Psychiatric: He has a normal mood and affect.    ED Course  Procedures (including critical care time) Labs Review Labs Reviewed  CBC WITH DIFFERENTIAL - Abnormal; Notable for the following:    WBC 12.2 (*)    Neutro Abs 7.9 (*)    Monocytes Relative 13 (*)    Monocytes Absolute 1.6 (*)    All other components within normal limits  SALICYLATE LEVEL - Abnormal; Notable for the following:    Salicylate Lvl <2.0 (*)    All other components within normal limits  I-STAT CHEM 8, ED - Abnormal; Notable for the following:    Creatinine, Ser 1.40 (*)    Calcium, Ion 1.09 (*)    Hemoglobin 18.4 (*)    HCT 54.0 (*)  All other components within normal limits  ACETAMINOPHEN LEVEL  URINE RAPID DRUG SCREEN (HOSP PERFORMED)  ETHANOL    Imaging Review No results found.   EKG Interpretation   Date/Time:  Tuesday June 12 2014 02:06:42 EDT Ventricular Rate:  134 PR Interval:  131 QRS Duration: 82 QT Interval:  314 QTC Calculation: 469 R Axis:   37 Text Interpretation:  Sinus tachycardia Probable inferior infarct, old  Confirmed by [facility] Inc[facility]UMBO-RASCH  MD, APRIL ([id number]) on 06/12/2014 3:24:27 AM      MDM    Final diagnoses:  None   To be seen by TTS    April K Palumbo-Rasch, MD 06/12/14 [id number]

## 2014-06-12 NOTE — ED Notes (Signed)
Per TTS Jessie Foot([place]), pt cleared by psychiatrist, just needs to be medically cleared by EDP, pt denied SI/HI. Effie ShyWentz, MD made aware.

## 2014-06-12 NOTE — ED Notes (Addendum)
Pt has in belonging bag:  Blue shoes, orange socks, white shirt, gray pants, blue hat, gray I-phone, two lighters, a key, [employer] cigs, one five dollar bill, two dollar bills, and changes, gray debit card [id number] at the nurses station
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
## 2014-06-13 ENCOUNTER — Emergency Department (HOSPITAL_COMMUNITY)
Admission: EM | Admit: 2014-06-13 | Discharge: 2014-06-15 | Disposition: A | Discharge status: Other Acute Inpatient Hospital | Attending: Emergency Medicine | Admitting: Emergency Medicine

## 2014-06-13 ENCOUNTER — Emergency Department (HOSPITAL_COMMUNITY)
Admission: EM | Admit: 2014-06-13 | Discharge: 2014-06-13 | Disposition: A | Discharge status: Home / Self Care | Attending: Emergency Medicine | Admitting: Emergency Medicine

## 2014-06-13 ENCOUNTER — Encounter (HOSPITAL_COMMUNITY): Payer: Self-pay | Admitting: Emergency Medicine

## 2014-06-13 DIAGNOSIS — J45909 Unspecified asthma, uncomplicated: Secondary | ICD-10-CM | POA: Insufficient documentation

## 2014-06-13 DIAGNOSIS — F1510 Other stimulant abuse, uncomplicated: Secondary | ICD-10-CM

## 2014-06-13 DIAGNOSIS — Z79899 Other long term (current) drug therapy: Secondary | ICD-10-CM | POA: Insufficient documentation

## 2014-06-13 DIAGNOSIS — F1210 Cannabis abuse, uncomplicated: Secondary | ICD-10-CM | POA: Insufficient documentation

## 2014-06-13 DIAGNOSIS — F17200 Nicotine dependence, unspecified, uncomplicated: Secondary | ICD-10-CM | POA: Insufficient documentation

## 2014-06-13 DIAGNOSIS — F1520 Other stimulant dependence, uncomplicated: Secondary | ICD-10-CM | POA: Insufficient documentation

## 2014-06-13 DIAGNOSIS — E876 Hypokalemia: Secondary | ICD-10-CM | POA: Insufficient documentation

## 2014-06-13 DIAGNOSIS — F1910 Other psychoactive substance abuse, uncomplicated: Secondary | ICD-10-CM

## 2014-06-13 DIAGNOSIS — R45851 Suicidal ideations: Secondary | ICD-10-CM | POA: Insufficient documentation

## 2014-06-13 HISTORY — DX: Other psychoactive substance abuse, uncomplicated: F19.10

## 2014-06-13 LAB — COMPREHENSIVE METABOLIC PANEL
ALBUMIN: 4.3 g/dL (ref 3.5–5.2)
ALT: 26 U/L (ref 0–53)
AST: 50 U/L — ABNORMAL HIGH (ref 0–37)
Alkaline Phosphatase: 68 U/L (ref 39–117)
BUN: 17 mg/dL (ref 6–23)
CO2: 18 mEq/L — ABNORMAL LOW (ref 19–32)
CREATININE: 1.2 mg/dL (ref 0.50–1.35)
Calcium: 9 mg/dL (ref 8.4–10.5)
Chloride: 101 mEq/L (ref 96–112)
GFR calc Af Amer: >90 mL/min (ref >90)
GFR calc non Af Amer: 78 mL/min — ABNORMAL LOW (ref >90)
Glucose, Bld: 78 mg/dL (ref 70–99)
Potassium: 3.2 mEq/L — ABNORMAL LOW (ref 3.7–5.3)
Sodium: 142 mEq/L (ref 137–147)
Total Bilirubin: 1.5 mg/dL — ABNORMAL HIGH (ref 0.3–1.2)
Total Protein: 6.9 g/dL (ref 6.0–8.3)

## 2014-06-13 LAB — CBC WITH DIFFERENTIAL/PLATELET
BASOS PCT: 0 % (ref 0–1)
Basophils Absolute: 0 10*3/uL (ref 0.0–0.1)
EOS ABS: 0.1 10*3/uL (ref 0.0–0.7)
EOS PCT: 1 % (ref 0–5)
HEMATOCRIT: 40.5 % (ref 39.0–52.0)
HEMOGLOBIN: 13.7 g/dL (ref 13.0–17.0)
Lymphocytes Relative: 16 % (ref 12–46)
Lymphs Abs: 1.4 10*3/uL (ref 0.7–4.0)
MCH: 30.3 pg (ref 26.0–34.0)
MCHC: 33.8 g/dL (ref 30.0–36.0)
MCV: 89.6 fL (ref 78.0–100.0)
MONO ABS: 1.2 10*3/uL — AB (ref 0.1–1.0)
Monocytes Relative: 13 % — ABNORMAL HIGH (ref 3–12)
Neutro Abs: 6.3 10*3/uL (ref 1.7–7.7)
Neutrophils Relative %: 70 % (ref 43–77)
Platelets: 168 10*3/uL (ref 150–400)
RBC: 4.52 MIL/uL (ref 4.22–5.81)
RDW: 12.9 % (ref 11.5–15.5)
WBC: 8.9 10*3/uL (ref 4.0–10.5)

## 2014-06-13 LAB — ETHANOL: Alcohol, Ethyl (B): <11 mg/dL (ref 0–11)

## 2014-06-13 LAB — SALICYLATE LEVEL: Salicylate Lvl: <2 mg/dL — ABNORMAL LOW (ref 2.8–20.0)

## 2014-06-13 LAB — ACETAMINOPHEN LEVEL

## 2014-06-13 MED ORDER — LORAZEPAM 1 MG PO TABS
1.0000 mg | ORAL_TABLET | Freq: Once | ORAL | Status: AC
  Administered 2014-06-13: 1 mg via ORAL
  Filled 2014-06-13: qty 1

## 2014-06-13 NOTE — BH Assessment (Signed)
Tele Assessment Note   Kyle Wilkins is an 33 y.o. male. 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 Pt is extremely restless, anxious, and fidgety during the assessment. Pt sts he "isn't sure if" he wants detox. His aunt is at bedside encourages him to get him and patient then sts, "Ok I want help".  Pt denies SI and HI. He reports one suicide attempt "a long time ago". [facility]  Per chart review, [facility] and he [facility] to [facility] for [facility] detox. Following discharge from [facility] he went to [facility] for the 28 day treatment program. Pt sts his bones ache, he feels nauseated, and anxiety. Pt sts hasn't slept for 3 days. His appetite is poor and he reports a significant amt of weight loss in the past few months. Consequences of patient's use include relational conflict, financial, and legal issues. He reports throwing a table thru a neighbors window 1 month ago stating he was high on meth. Pt sts he injects meth daily for the past 6 weeks. His last use last night. He typically uses 2.5 grams per day. He reports no support system. He endorses "depressed" mood with irritability, worthlessness, guilt, loss of interest in usual pleasures, tearfulness, isolating and fatigue.  Writer discussed patient's treatment plans with AC-Eric. Patient does not meet criteria for detox due to no detox protocol for crystal meth. He also denies SI, HI, and AVH's. Writer contacted [facility] and spoke to Glen Alpinearol. Per Okey Regalarol, crystal meth is not a typical detox for their facility. Patient declined at [facility] as Conan BowensCarol sts, "It will take too long for him to come of this substance so we can't accommodate his needs at this time".  Writer then [facility]. Sts that patient does not meet criteria for the detox program but may meet criteria for the residential. She recommends that patient calls Friday stating, "We will not have any beds available until then". Patient agrees with plan to call  [facility] treatment.      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 Asthma   . Polysubstance abuse    Axis IV: economic problems, educational problems, occupational problems, other psychosocial or environmental problems, problems related to social environment, problems with access to health care services and problems with primary support group Axis V: 31-40 impairment in reality testing  Past Medical History:  Past Medical History  Diagnosis Date  . Asthma   . Polysubstance abuse     Past Surgical History  Procedure Laterality Date  . Hernia repair      Family History: No family history on file.  Social History:  reports that he has been smoking Cigarettes.  He has been smoking about 1.00 pack per day. He has never used smokeless tobacco. He reports that he drinks alcohol. He reports that he uses illicit drugs (Methamphetamines).  Additional Social History:  Alcohol / Drug Use Pain Medications: SEE MAR Prescriptions: SEE MAR Over the Counter: SEE MAR History of alcohol / drug use?: Yes Longest period of sobriety (when/how long): 7 months  Negative Consequences of Use: Financial;Legal;Personal relationships;Work / [employer] Substance #1 Name of Substance 1: Crystal Meth (IV use) 1 - Age of First Use: "[age] or 33 yrs old" 1 - Amount (size/oz): 2.5 grams per day 1 - Frequency: daily  1 -  Duration: on-going  1 - Last Use / Amount: 06/13/14  CIWA: CIWA-Ar BP: 146/85 mmHg Pulse Rate: 118 COWS:    Allergies:  Allergies  Allergen Reactions  . Ceclor [Cefaclor] Rash    Home Medications:  (Not in a hospital admission)  OB/GYN Status:  No LMP for male patient.  General Assessment Data Location of Assessment[facility] Is this a Tele or Face-to-Face Assessment?: Face-to-Face Is this an Initial Assessment or a Re-assessment for this  encounter?: Initial Assessment Living Arrangements: Non-relatives/Friends Can pt return to current living arrangement?: Yes Admission Status: Voluntary Is patient capable of signing voluntary admission?: Yes Transfer from: Home Referral Source: Self/Family/Friend     [facility] Living Arrangements: Non-relatives/Friends Name of Psychiatrist: none Name of Therapist: none  Education Status Is patient currently in school?: No Current Grade:  (na) Highest grade of school patient has completed: 12 Name of school: [employer]  Risk to self Suicidal Ideation: No Suicidal Intent: No Is patient at risk for suicide?: No Suicidal Plan?: No Access to Means: No What has been your use of drugs/alcohol within the last 12 months?:  (crystal meth ) Previous Attempts/Gestures: Yes How many times?:  (0) Other Self Harm Risks:  (none reported ) Triggers for Past Attempts: Unknown;Unpredictable Intentional Self Injurious Behavior: None Family Suicide History: No Recent stressful life event(s): Other (Comment);Recent negative physical changes (loss wt.) Persecutory voices/beliefs?: No Depression: Yes Depression Symptoms: Feeling angry/irritable;Feeling worthless/self pity;Loss of interest in usual pleasures;Guilt;Fatigue;Isolating;Tearfulness Substance abuse history and/or treatment for substance abuse?: No Suicide prevention information given to non-admitted patients: Not applicable  Risk to Others Homicidal Ideation: No Thoughts of Harm to Others: No Current Homicidal Intent: No Current Homicidal Plan: No Access to Homicidal Means: No Identified Victim:  (none reported ) History of harm to others?: No Assessment of Violence: None Noted Violent Behavior Description:  (patient fidgety, restless, and anxious) Does patient have access to weapons?: No Criminal Charges Pending?: No Does patient have a court date: No  Psychosis Hallucinations: None noted  (only while using meth) Delusions: None noted  Mental Status Report Appear/Hygiene: Disheveled Eye Contact: Fair Motor Activity: Freedom of movement Speech: Logical/coherent;Slurred Level of Consciousness: Sleeping;Drowsy Mood: Depressed Affect: Appropriate to circumstance;Sad;Depressed Anxiety Level: Moderate Thought Processes: Relevant Judgement: Impaired Orientation: Person;Place;Time;Situation Obsessive Compulsive Thoughts/Behaviors: None  Cognitive Functioning Concentration: Decreased Memory: Recent Intact;Remote Intact IQ: Average Insight: Fair Impulse Control: Poor Appetite: Poor Weight Loss:  (pt does report wt. loss but unsure of how much) Weight Gain:  (none reported ) Sleep: Decreased Total Hours of Sleep:  (3 hrs worth of sleep per night) Vegetative Symptoms: None  ADLScreening [facility] Patient's cognitive ability adequate to safely complete daily activities?: Yes Patient able to express need for assistance with ADLs?: Yes Independently performs ADLs?: Yes (appropriate for developmental age)  Prior Inpatient Therapy Prior Inpatient Therapy: Yes Prior Therapy Dates: 2014 and 1998 Prior Therapy Facilty/Provider(s): 2014 - [facility]; [facility] Reason for Treatment: meth detox&treatment ; SI  Prior Outpatient Therapy Prior Outpatient Therapy: No  ADL Screening (condition at time of admission) Patient's cognitive ability adequate to safely complete daily activities?: Yes Is the patient deaf or have difficulty hearing?: No Does the patient have difficulty seeing, even when wearing glasses/contacts?: No Does the patient have difficulty concentrating, remembering, or making decisions?: Yes (due to substance induced) Patient able to express need for assistance with ADLs?: Yes Does the patient have difficulty dressing or bathing?: No Independently performs ADLs?:  Yes (appropriate for developmental age) Does the patient have difficulty  walking or climbing stairs?: No Weakness of Legs: None Weakness of Arms/Hands: None  Home Assistive Devices/Equipment Home Assistive Devices/Equipment: None    Abuse/Neglect Assessment (Assessment to be complete while patient is alone) Physical Abuse: Denies Verbal Abuse: Denies Sexual Abuse: Denies Exploitation of patient/patient's resources: Denies Self-Neglect: Denies Values / Beliefs Cultural Requests During Hospitalization: None Spiritual Requests During Hospitalization: None     Nutrition Screen- [facility] Adult/[facility] Patient's home diet: Regular  Additional Information 1:1 In Past 12 Months?: No CIRT Risk: No Elopement Risk: No Does patient have medical clearance?: Yes     Disposition:  Disposition Initial Assessment Completed for this Encounter: Yes Disposition of Patient: Outpatient treatment (Patient to follow up with outpatient residential services ) Type of outpatient treatment: Adult (Pt to call [facility] 06/15/2014 and ask for Melissa)  Melynda Ripple [facility] 06/13/2014 3:36 PM

## 2014-06-13 NOTE — ED Provider Notes (Signed)
CSN: [id number]     Arrival date & time 06/13/14  1315 History   First MD Initiated Contact with Patient 06/13/14 1326     Chief Complaint  Patient presents with  . Drug Overdose      HPI Pt was seen at 1340. Per pt and his aunt, c/o gradual onset and persistence of constant methamphetamine abuse since yesterday. [facility], and discharged yesterday afternoon. Pt has been using meth since his discharge. Pt states he came to the ED today "to get back into detox." States he went to [facility] year for meth detox. Denies SI, no SA, no HI, no hallucinations. Denies CP/palpitations, no SOB/cough, no abd pain, no N/V/D.    Past Medical History  Diagnosis Date  . Asthma   . Polysubstance abuse    Past Surgical History  Procedure Laterality Date  . Hernia repair      History  Substance Use Topics  . Smoking status: Current Every Day Smoker -- 1.00 packs/day    Types: Cigarettes  . Smokeless tobacco: Never Used  . Alcohol Use: Yes     Comment: binge drinker    Review of Systems ROS: Statement: All systems negative except as marked or noted in the HPI; Constitutional: Negative for fever and chills. ; ; Eyes: Negative for eye pain, redness and discharge. ; ; ENMT: Negative for ear pain, hoarseness, nasal congestion, sinus pressure and sore throat. ; ; Cardiovascular: Negative for chest pain, palpitations, diaphoresis, dyspnea and peripheral edema. ; ; Respiratory: Negative for cough, wheezing and stridor. ; ; Gastrointestinal: Negative for nausea, vomiting, diarrhea, abdominal pain, blood in stool, hematemesis, jaundice and rectal bleeding. . ; ; Genitourinary: Negative for dysuria, flank pain and hematuria. ; ; Musculoskeletal: Negative for back pain and neck pain. Negative for swelling and trauma.; ; Skin: Negative for pruritus, rash, abrasions, blisters, bruising and skin lesion.; ; Neuro: Negative for headache, lightheadedness and neck stiffness. Negative for  weakness, altered level of consciousness , altered mental status, extremity weakness, paresthesias, involuntary movement, seizure and syncope.; Psych:  +anxiety. No SI, no SA, no HI, no hallucinations.       Allergies  Ceclor  Home Medications   Prior to Admission medications   Medication Sig Start Date End Date Taking? Authorizing Provider  albuterol (PROVENTIL HFA;VENTOLIN HFA) 108 (90 BASE) MCG/ACT inhaler Inhale 1-2 puffs into the lungs every 6 (six) hours as needed for wheezing or shortness of breath.    Historical Provider, MD   BP 146/85  Pulse 118  Temp(Src) 98.6 F (37 C) (Oral)  Resp 23  SpO2 99% Physical Exam 1345: Physical examination:  Nursing notes reviewed; Vital signs and O2 SAT reviewed;  Constitutional: Well developed, Well nourished, Well hydrated, In no acute distress; Head:  Normocephalic, atraumatic; Eyes: EOMI, PERRL, No scleral icterus; ENMT: Mouth and pharynx normal, Mucous membranes moist; Neck: Supple, Full range of motion, No lymphadenopathy; Cardiovascular: Tachycardic rate and rhythm, No gallop; Respiratory: Breath sounds clear & equal bilaterally, No wheezes.  Speaking full sentences with ease, Normal respiratory effort/excursion; Chest: Nontender, Movement normal; Abdomen: Soft, Nontender, Nondistended, Normal bowel sounds;; Extremities: Pulses normal, No tenderness, No edema, No calf edema or asymmetry.; Neuro: AA&Ox3, Major CN grossly intact.  Speech clear. No gross focal motor or sensory deficits in extremities.; Skin: Color normal, Warm, Dry.; Psych:  Anxious, rapid speech.   ED Course  Procedures    EKG Interpretation   Date/Time:  Wednesday June 13 2014 13:35:20  EDT Ventricular Rate:  113 PR Interval:  149 QRS Duration: 77 QT Interval:  325 QTC Calculation: 446 R Axis:   59 Text Interpretation:  Sinus tachycardia Biatrial enlargement When compared  with ECG of 06/12/2014 No significant change was found Confirmed by  [facility]  MD, Nicholos JohnsKATHLEEN  [phone]([id number]) on 06/13/2014 2:49:41 PM      MDM  .MDM Reviewed: previous chart, nursing note and vitals Reviewed previous: labs and ECG Interpretation: labs and ECG   Results for orders placed during the hospital encounter of 06/13/14  ACETAMINOPHEN LEVEL      Result Value Ref Range   Acetaminophen (Tylenol), Serum <15.0  10 - 30 ug/mL  ETHANOL      Result Value Ref Range   Alcohol, Ethyl (B) <11  0 - 11 mg/dL  SALICYLATE LEVEL      Result Value Ref Range   Salicylate Lvl <2.0 (*) 2.8 - 20.0 mg/dL  CBC WITH DIFFERENTIAL      Result Value Ref Range   WBC 8.9  4.0 - 10.5 K/uL   RBC 4.52  4.22 - 5.81 MIL/uL   Hemoglobin 13.7  13.0 - 17.0 g/dL   HCT 19.140.5  47.839.0 - 29.552.0 %   MCV 89.6  78.0 - 100.0 fL   MCH 30.3  26.0 - 34.0 pg   MCHC 33.8  30.0 - 36.0 g/dL   RDW 62.112.9  30.811.5 - 65.715.5 %   Platelets 168  150 - 400 K/uL   Neutrophils Relative % 70  43 - 77 %   Neutro Abs 6.3  1.7 - 7.7 K/uL   Lymphocytes Relative 16  12 - 46 %   Lymphs Abs 1.4  0.7 - 4.0 K/uL   Monocytes Relative 13 (*) 3 - 12 %   Monocytes Absolute 1.2 (*) 0.1 - 1.0 K/uL   Eosinophils Relative 1  0 - 5 %   Eosinophils Absolute 0.1  0.0 - 0.7 K/uL   Basophils Relative 0  0 - 1 %   Basophils Absolute 0.0  0.0 - 0.1 K/uL  COMPREHENSIVE METABOLIC PANEL      Result Value Ref Range   Sodium 142  137 - 147 mEq/L   Potassium 3.2 (*) 3.7 - 5.3 mEq/L   Chloride 101  96 - 112 mEq/L   CO2 18 (*) 19 - 32 mEq/L   Glucose, Bld 78  70 - 99 mg/dL   BUN 17  6 - 23 mg/dL   Creatinine, Ser 8.461.20  0.50 - 1.35 mg/dL   Calcium 9.0  8.4 - 96.210.5 mg/dL   Total Protein 6.9  6.0 - 8.3 g/dL   Albumin 4.3  3.5 - 5.2 g/dL   AST 50 (*) 0 - 37 U/L   ALT 26  0 - 53 U/L   Alkaline Phosphatase 68  39 - 117 U/L   Total Bilirubin 1.5 (*) 0.3 - 1.2 mg/dL   GFR calc non Af Amer 78 (*) >90 mL/min   GFR calc Af Amer >90  >90 mL/min     1515:  Pt given PO ativan on arrival for anxiety with improvement. Pt appears calmer, more cooperative. TTS has  evaluated pt: pt continues to deny SI/HI/hallucinations, continues to request detox, [facility] does not have beds until Friday (in 2 days), she will fax over information and pt can f/u there in 2 days. Pt and family are agreeable with this plan.        Laray AngerKathleen M McManus, DO 06/16/14 2131

## 2014-06-13 NOTE — ED Notes (Signed)
[facility] with c/o hallucinations, anxiety, and agitation after 4 days of meth use.  Pt oriented to self only, cooperative at this time.

## 2014-06-13 NOTE — ED Notes (Signed)
Dr Clarene DukeMcManus aware pt was unable to urinate prior to discharge.

## 2014-06-13 NOTE — Discharge Instructions (Signed)
°Emergency Department Resource Guide °1) Find a Doctor and Pay Out of Pocket °Although you won't have to find out who is covered by your insurance plan, it is a good idea to ask around and get recommendations. You will then need to call the office and see if the doctor you have chosen will accept you as a new patient and what types of options they offer for patients who are self-pay. Some doctors offer discounts or will set up payment plans for their patients who do not have insurance, but you will need to ask so you aren't surprised when you get to your appointment. ° °2) Contact Your Local Health Department °Not all health departments have doctors that can see patients for sick visits, but many do, so it is worth a call to see if yours does. If you don't know where your local health department is, you can check in your phone book. The CDC also has a tool to help you locate your state's health department, and many state websites also have listings of all of their local health departments. ° °3) Find a Walk-in Clinic °If your illness is not likely to be very severe or complicated, you may want to try a walk in clinic. These are popping up all over the country in pharmacies, drugstores, and shopping centers. They're usually staffed by nurse practitioners or physician assistants that have been trained to treat common illnesses and complaints. They're usually fairly quick and inexpensive. However, if you have serious medical issues or chronic medical problems, these are probably not your best option. ° °No Primary Care Doctor: °- Call [facility] at  832-8000 - they can help you locate a primary care doctor that  accepts your insurance, provides certain services, etc. °- Physician Referral Service- 1-800-533-3463 ° °Chronic Pain Problems: °Organization         Address  Phone   Notes  °[facility]  (336) 297-2271 Patients need to be referred by their primary care doctor.  ° °Medication  Assistance: °Organization         Address  Phone   Notes  °[place] [facility] [address] °[place], [place] [address] (336) 641-8030 --Must be a resident of [place] °-- Must have NO insurance coverage whatsoever (no Medicaid/ Medicare, etc.) °-- The pt. MUST have a primary care doctor that directs their care regularly and follows them in the community °  °[facility]  (866) 331-1348   °[employer]  (888) 892-1162   ° °Agencies that provide inexpensive medical care: °Organization         Address  Phone   Notes  °[facility]  (336) 832-8035   °[facility]    (336) 832-7272   °[facility] [address] °[place], [place] [address] (336) 832-4777   °[facility] [address], °[place] (336) 271-4999   °[facility]    (336) 373-0678   °[facility]    (336) 272-1050   °[facility] ° [address], [place] Phone:  (336) 832-4444, Fax:  (336) 832-4440 Hours of Operation:  9 am - 6 pm, M-F.  Also accepts Medicaid/Medicare and self-pay.  °[facility] ° [address], [place] Phone: (336) 832-3150, Fax: (336) 832-3151. Hours of Operation:  8:30 am - 5:30 pm, M-F.  Also accepts Medicaid and self-pay.  °[facility] [place] [address], [place] Phone: (336) 878-6027   °[facility] [address], [place], [place] (336)723-1848, Ext. 123 Mondays & Thursdays: 7-9 AM.  First 15 patients are seen on a first come, first serve basis. °  ° °Medicaid-accepting [place] Providers: ° °Organization         Address  Phone   Notes  °[facility] [address], [place] (336) 641-2100 Also accepts self-pay patients.  °[facility] [address], [place] ° (336) 856-9996   °[facility] [address], [place]  (336) 288-8857   °[facility] [address] [place] [address], [place] (336) 299-7000   °Veita Bland [address], [place]  ° (336) 373-1557 Only accepts [place] Access Medicaid patients after they have their name applied to their card.  ° °Self-Pay (no insurance) in [place]: ° °Organization         Address  Phone   Notes  °Sickle Cell Patients, [facility] [address], [place] (336) 832-1970   °[facility] [address], [place] (336) 832-4400   °[facility] ° [address], [place] (336) 992-4800   °[facility]/Dr. Osei-Bonsu ° [address] [place] [address], [place] or [address], [place] (336) 841-8500 Phone number for both [place] and [place] locations is the same.  °[facility] [address], [place] (336) 299-0000   °[facility] [address] [place] [address], [place] or [address] (336) 852-7530 °(336) 878-2260   °[facility] [address], [place] (336) 350-1642, phone; (336) 294-5005, fax Sees patients 1st and 3rd Saturday of every month.  Must not qualify for public or private insurance (i.e. Medicaid, Medicare, [place] Health Choice, Veterans' Benefits) • Household income should be no more than 200% of the poverty level •The clinic cannot treat you if you are pregnant or think you are pregnant • Sexually transmitted diseases are not treated at the clinic.  ° ° °Dental Care: °Organization         Address  Phone  Notes  °[place] [facility] [address], [place] (336) 641-6152 Accepts children up to age 21 who are enrolled in Medicaid or [place] Health Choice; pregnant women with a Medicaid card; and children who have applied for Medicaid or [place] Health Choice, but were declined, whose parents can pay a reduced fee at time of service.  °[place]  [facility] [place]  [address], [place] (336) 641-7733 Accepts children up to age 21 who are enrolled in Medicaid or [place] Health Choice; pregnant women with a Medicaid card; and children who have applied for Medicaid or [place] Health Choice, but were declined, whose parents can pay a reduced fee at time of service.  °[facility] ° [address], [place] (336) 641-4533 Patients are seen by appointment only. Walk-ins are not accepted. [facility] will see patients 18 years of age and older. °Monday - Tuesday (8am-5pm) °Most Wednesdays (8:30-5pm) °$30 per visit, cash only  °[facility] ° [address], [place] (336) 641-4533 Patients are seen by appointment only. Walk-ins are not accepted. [facility] will see patients 18 years of age and older. °One   Wednesday Evening (Monthly: Volunteer Based).  $30 per visit, cash only  °[facility]  (919) 537-3737 for adults; Children under age 4, call Graduate Pediatric Dentistry at (919) [phone]. Children aged 4-14, please call (919) [phone] to request a pediatric application. ° Dental services are provided in all areas of dental care including fillings, crowns and bridges, complete and partial dentures, implants, gum treatment, root canals, and extractions. Preventive care is also provided. Treatment is provided to both adults and children. °Patients are selected via a lottery and there is often a waiting list. °  °[facility] [address], °[place] ° (336) 763-8833 [email or web address] °  °[facility] [address], [place], [place] (336)723-1848, Ext. 123 Second and Fourth Thursday of each month, opens at 6:30 AM; Clinic ends at 9 AM.  Patients are seen on a first-come first-served basis, and a limited number are seen during each clinic.  ° °[facility] ° [address], [place], [place] (336) 723-7904    Eligibility Requirements °You must have lived in [place], [place], or [place] counties for at least the last three months. °  You cannot be eligible for state or federal sponsored healthcare insurance, including Veterans Administration, Medicaid, or Medicare. °  You generally cannot be eligible for healthcare insurance through your employer.  °  How to apply: °Eligibility screenings are held every Tuesday and Wednesday afternoon from 1:00 pm until 4:00 pm. You do not need an appointment for the interview!  °[facility] [address], [place], [place] 336-631-2330   °[facility]  336-342-8273   °[facility]  336-703-3100   °[facility]  336-570-6415   ° °Behavioral Health Resources in the Community: °Intensive Outpatient Programs °Organization         Address  Phone  Notes  °[facility] [address], [place], [place] 336-878-6098   °[facility] [address], [place], [place] 336-832-9800   °[facility] [address], [place], [place] ° 336-882-2125   °[facility] [address],  °[place], [place] 1-800-853-5163 or 336-641-4981   °Substance Abuse Resources °Organization         Address  Phone  Notes  °[facility]  336-882-2125   °[facility]  336-784-9470   °[facility]  336-285-9073   °[facility]  336-845-3988   °Residential & Outpatient Substance Abuse Program  1-800-659-3381   °Psychological Services °Organization         Address  Phone  Notes  °[facility]  336- 832-9600   °[facility]  336- 378-7881   °[facility] [address], [place] 1-800-853-5163 or 336-641-4981   ° °Mobile Crisis Teams °Organization         Address  Phone  Notes  °[facility], Mobile Crisis Care Unit  1-877-626-1772   °Assertive °[facility] ° [address]  [place], [place] 336-834-9664   °Sharon DeEsch [address] °[place] 336-554-5454   ° °Self-Help/Support Groups °Organization         Address  Phone             Notes  °[facility]oc[facility] of  - variety of support groups  336- 373-1402 Call for more information  °Narcotics Anonymous (NA), [facility] [address], °[place]  2 meetings at this location  ° °  Residential Treatment Programs Organization         Address  Phone  Notes  [facility] [address],    [place]  [phone]   [facility]  [address], [place] [phone], [place], [place] [phone]   [facility] [address], [place] [phone]) [phone] Admissions: 8am-3pm M-F  [facility] [address]    [place], [place] [phone](512) [phone]   [facility] [address], [place], [place] [phone]   [facility] [address]  [place], [place] [phone]   [facility] Intensive Outpatient [address], [place], [place] [phone]   [facility] ([facility] [address]  [place], [place] [phone] or [phone]   [facility] ([facility]) [address] [place], [place] [phone] Accepts Medicaid  [facility] [address]  [place] [phone] Substance Abuse/Addiction Treatment   [facility] Organization         Address  Phone  Notes  [facility]  (403)872-9792(888) ([phone])[phone]   Angie FavaJulie Brannon, PhD [address], [address] [place], [place]   [phone](336) [phone] or [phone](336) [phone]   [facility]   [address] [place], [place] [phone]   [facility] [address], [place], [place] [phone](336) [phone] Insurance/Medicaid/sponsorship through [facility] [address]                                    [place], [place] [phone]) [phone] Therapy/tele-psych/case    [facility] [address]   [place], [place] [phone]) [phone]    Dr. Lolly MustacheArfeen  [phone](336) [phone]   [facility]  [employer] [facility] 1) [address], [place] 2) [address], [place] 3)  [address], [place] [phone](336) [phone](336) [phone]  [phone](336) [phone]   [facility] Child Abuse Hot[facility] [phone](336) [phone] or [phone](336) [phone] (After Hours)       Follow up with [facility] in 2 days as arranged by the Emergency Department mental health staff today.

## 2014-06-13 NOTE — ED Notes (Signed)
Pt brought in by [employer] voluntarily  Pt's boss called the police for the pt told him that he was hearing voices that were telling him they were going to hurt him and his mother  Pt told his boss he just wanted to end it all by shooting himself  Pt is lethargic and speech is mumbling in conversation  Pt unable to stay awake in triage  Pt was just seen here recently for drug overdose  Pt admits to using meth

## 2014-06-14 DIAGNOSIS — F4322 Adjustment disorder with anxiety: Secondary | ICD-10-CM | POA: Diagnosis not present

## 2014-06-14 DIAGNOSIS — F1910 Other psychoactive substance abuse, uncomplicated: Secondary | ICD-10-CM | POA: Diagnosis not present

## 2014-06-14 LAB — COMPREHENSIVE METABOLIC PANEL
ALBUMIN: 4.1 g/dL (ref 3.5–5.2)
ALK PHOS: 74 U/L (ref 39–117)
ALT: 27 U/L (ref 0–53)
AST: 42 U/L — AB (ref 0–37)
BILIRUBIN TOTAL: 0.9 mg/dL (ref 0.3–1.2)
BUN: 17 mg/dL (ref 6–23)
CHLORIDE: 103 meq/L (ref 96–112)
CO2: 22 mEq/L (ref 19–32)
Calcium: 8.9 mg/dL (ref 8.4–10.5)
Creatinine, Ser: 1.18 mg/dL (ref 0.50–1.35)
GFR calc Af Amer: >90 mL/min (ref >90)
GFR calc non Af Amer: 80 mL/min — ABNORMAL LOW (ref >90)
Glucose, Bld: 100 mg/dL — ABNORMAL HIGH (ref 70–99)
POTASSIUM: 3 meq/L — AB (ref 3.7–5.3)
Sodium: 141 mEq/L (ref 137–147)
Total Protein: 6.7 g/dL (ref 6.0–8.3)

## 2014-06-14 LAB — ACETAMINOPHEN LEVEL

## 2014-06-14 LAB — CBC
HCT: 42.8 % (ref 39.0–52.0)
Hemoglobin: 14.9 g/dL (ref 13.0–17.0)
MCH: 31.5 pg (ref 26.0–34.0)
MCHC: 34.8 g/dL (ref 30.0–36.0)
MCV: 90.5 fL (ref 78.0–100.0)
PLATELETS: 168 10*3/uL (ref 150–400)
RBC: 4.73 MIL/uL (ref 4.22–5.81)
RDW: 12.9 % (ref 11.5–15.5)
WBC: 6.3 10*3/uL (ref 4.0–10.5)

## 2014-06-14 LAB — RAPID URINE DRUG SCREEN, HOSP PERFORMED
Amphetamines: POSITIVE — AB
BARBITURATES: NOT DETECTED
BENZODIAZEPINES: NOT DETECTED
Cocaine: NOT DETECTED
Opiates: NOT DETECTED
TETRAHYDROCANNABINOL: POSITIVE — AB

## 2014-06-14 LAB — SALICYLATE LEVEL: Salicylate Lvl: <2 mg/dL — ABNORMAL LOW (ref 2.8–20.0)

## 2014-06-14 LAB — ETHANOL: Alcohol, Ethyl (B): <11 mg/dL (ref 0–11)

## 2014-06-14 MED ORDER — IBUPROFEN 200 MG PO TABS: 600.0000 mg | ORAL_TABLET | Freq: Three times a day (TID) | ORAL | Status: DC | PRN

## 2014-06-14 MED ORDER — NICOTINE 21 MG/24HR TD PT24: 21.0000 mg | MEDICATED_PATCH | Freq: Every day | TRANSDERMAL | Status: DC

## 2014-06-14 MED ORDER — ALBUTEROL SULFATE (2.5 MG/3ML) 0.083% IN NEBU
2.5000 mg | INHALATION_SOLUTION | Freq: Four times a day (QID) | RESPIRATORY_TRACT | Status: DC | PRN
  Filled 2014-06-14: qty 3

## 2014-06-14 MED ORDER — ZOLPIDEM TARTRATE 5 MG PO TABS: 5.0000 mg | ORAL_TABLET | Freq: Every evening | ORAL | Status: DC | PRN

## 2014-06-14 MED ORDER — ONDANSETRON HCL 4 MG PO TABS: 4.0000 mg | ORAL_TABLET | Freq: Three times a day (TID) | ORAL | Status: DC | PRN

## 2014-06-14 MED ORDER — ALBUTEROL SULFATE HFA 108 (90 BASE) MCG/ACT IN AERS: 1.0000 | INHALATION_SPRAY | Freq: Four times a day (QID) | RESPIRATORY_TRACT | Status: DC | PRN

## 2014-06-14 MED ORDER — POTASSIUM CHLORIDE CRYS ER 20 MEQ PO TBCR
40.0000 meq | EXTENDED_RELEASE_TABLET | Freq: Once | ORAL | Status: AC
  Administered 2014-06-14: 40 meq via ORAL
  Filled 2014-06-14: qty 2

## 2014-06-14 NOTE — ED Provider Notes (Signed)
Medical screening examination/treatment/procedure(s) were performed by non-physician practitioner and as supervising physician I was immediately available for consultation/collaboration.   EKG Interpretation None       Marlayna Bannister K Linker, MD 06/14/14 0412 

## 2014-06-14 NOTE — ED Notes (Signed)
Pt sleeping at present, will monitor for safety. 

## 2014-06-14 NOTE — ED Provider Notes (Signed)
CSN: [id number]     Arrival date & time 06/13/14  2254 History   First MD Initiated Contact with Patient 06/14/14 0033     Chief Complaint  Patient presents with  . Medical Clearance   HPI  History provided by the patient. Patient is a 33 year old male with history of polysubstance abuse presenting by [employer] voluntarily for request with help with substance abuse and worsening depression. Patient admits to continued regular drug abuse. Patient also having worsening depression and made comments about wishing to shoot himself. The patient states that he feels very stressed about his substance abuse does not wish to live any longer. He reports using methamphetamines prior to arrival. He denies any past history of suicide attempts. Denies any suicide attempt tonight. Denies any HI.   Past Medical History  Diagnosis Date  . Asthma   . Polysubstance abuse    Past Surgical History  Procedure Laterality Date  . Hernia repair     History reviewed. No pertinent family history. History  Substance Use Topics  . Smoking status: Current Every Day Smoker -- 1.00 packs/day    Types: Cigarettes  . Smokeless tobacco: Never Used  . Alcohol Use: Yes     Comment: binge drinker    Review of Systems  All other systems reviewed and are negative.     Allergies  Ceclor  Home Medications   Prior to Admission medications   Medication Sig Start Date End Date Taking? Authorizing Provider  albuterol (PROVENTIL HFA;VENTOLIN HFA) 108 (90 BASE) MCG/ACT inhaler Inhale 1-2 puffs into the lungs every 6 (six) hours as needed for wheezing or shortness of breath.    Historical Provider, MD   BP 128/78  Pulse 89  Temp(Src) 98.1 F (36.7 C) (Oral)  Resp 14  SpO2 99% Physical Exam  Nursing note and vitals reviewed. Constitutional: He appears well-developed and well-nourished.  HENT:  Head: Normocephalic.  Cardiovascular: Normal rate and regular rhythm.   Pulmonary/Chest: Effort normal and breath sounds  normal. No respiratory distress. He has no wheezes.  Abdominal: Soft. There is no tenderness. There is no rebound and no guarding.  Musculoskeletal: Normal range of motion.  Neurological: He is alert.  Skin: Skin is warm.  Psychiatric: His behavior is normal. He exhibits a depressed mood. He expresses suicidal ideation.    ED Course  Procedures    COORDINATION OF CARE:  Nursing notes reviewed. Vital signs reviewed. Initial pt interview and examination performed.   Filed Vitals:   06/13/14 2325 06/13/14 2354  BP: 128/74 128/78  Pulse: 96 89  Temp: 98 F (36.7 C) 98.1 F (36.7 C)  TempSrc: Oral Oral  Resp: 18 14  SpO2: 98% 99%    1:37 AM-patient seen and evaluated. Appears in no acute distress. Has history of polysubstance abuse.  Patient medically cleared for further drug abuse evaluation.  Psychiatric holding orders and place. TTS consult placed.    Results for orders placed during the hospital encounter of 06/13/14  ACETAMINOPHEN LEVEL      Result Value Ref Range   Acetaminophen (Tylenol), Serum <15.0  10 - 30 ug/mL  CBC      Result Value Ref Range   WBC 6.3  4.0 - 10.5 K/uL   RBC 4.73  4.22 - 5.81 MIL/uL   Hemoglobin 14.9  13.0 - 17.0 g/dL   HCT 82.942.8  56.239.0 - 13.052.0 %   MCV 90.5  78.0 - 100.0 fL   MCH 31.5  26.0 - 34.0 pg  MCHC 34.8  30.0 - 36.0 g/dL   RDW 16.112.9  09.611.5 - 04.515.5 %   Platelets 168  150 - 400 K/uL  COMPREHENSIVE METABOLIC PANEL      Result Value Ref Range   Sodium 141  137 - 147 mEq/L   Potassium 3.0 (*) 3.7 - 5.3 mEq/L   Chloride 103  96 - 112 mEq/L   CO2 22  19 - 32 mEq/L   Glucose, Bld 100 (*) 70 - 99 mg/dL   BUN 17  6 - 23 mg/dL   Creatinine, Ser 4.091.18  0.50 - 1.35 mg/dL   Calcium 8.9  8.4 - 81.110.5 mg/dL   Total Protein 6.7  6.0 - 8.3 g/dL   Albumin 4.1  3.5 - 5.2 g/dL   AST 42 (*) 0 - 37 U/L   ALT 27  0 - 53 U/L   Alkaline Phosphatase 74  39 - 117 U/L   Total Bilirubin 0.9  0.3 - 1.2 mg/dL   GFR calc non Af Amer 80 (*) >90 mL/min   GFR  calc Af Amer >90  >90 mL/min  ETHANOL      Result Value Ref Range   Alcohol, Ethyl (B) <11  0 - 11 mg/dL  SALICYLATE LEVEL      Result Value Ref Range   Salicylate Lvl <2.0 (*) 2.8 - 20.0 mg/dL  URINE RAPID DRUG SCREEN (HOSP PERFORMED)      Result Value Ref Range   Opiates NONE DETECTED  NONE DETECTED   Cocaine NONE DETECTED  NONE DETECTED   Benzodiazepines NONE DETECTED  NONE DETECTED   Amphetamines POSITIVE (*) NONE DETECTED   Tetrahydrocannabinol POSITIVE (*) NONE DETECTED   Barbiturates NONE DETECTED  NONE DETECTED      MDM   Final diagnoses:  Polysubstance abuse  Hypokalemia        Angus Sellereter S Dammen, PA-C 06/14/14 0410

## 2014-06-14 NOTE — ED Notes (Signed)
Patient has two bags in looker 31.

## 2014-06-14 NOTE — Consult Note (Signed)
Face to face evaluation and I agree with this

## 2014-06-14 NOTE — Consult Note (Signed)
[facility] Consult   Reason for Consult:  Bad trip on crystal meth Referring Physician:  ER MD  Kyle Wilkins is an 33 y.o. male. 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 Asthma   . Polysubstance abuse    AXIS IV:  misuse of illicit substances AXIS V:  51-60 moderate symptoms  Plan:  No evidence of imminent risk to self or others at present.   Supportive therapy provided about ongoing stressors. Discussed crisis plan, support from social network, calling 911, coming to the Emergency Department, and calling Suicide Hotline. -Hold in [facility] ED overnight for evaluation of detoxification from methamphetamines and other unknown drug substances. Consider discharge on Friday, 06/15/14 if pt continues to improve.   Subjective:   Kyle Wilkins is a 33 y.o. male patient admitted with complaints of difficulty dealing with recent crystal meth consumption. Today, pt denies SI, HI, and AVH, contracts for safety. Pt appears to be visibly uncomfortable during assessment and startled when myself and Dr. Lovena Le entered the room to perform assessment. Pt reports that he is still "feeling very bad" and that he would like to spend the night in the hospital to "sleep it off" and determine how he feels in the morning. This NP and Dr. Lovena Le in agreement with this plan as a means to determine proper disposition of inpatient vs. outpatient management.   HPI:  Kyle Wilkins says he has been taking crystal meth recreationally.  On 06/11/14, he took more than he usually takes and believes it was laced with something else as he had a very bad reaction to the point of feeling people were trying to kill him and also had thoughts of wanting to kill himself.  He is not suicidal today but is still a bit confused.  He is not sure about people wanting to hurt  him but is aware that this comes from his bad experience with the meth.  HPI Elements:   Location:  bad experience with methamphetamine. Quality:  paranoid Severity:  as above. Timing:  took twice as much meth as usual and believes it was laced with something else. Duration:  uses every now and then Context:  as above.  Past Psychiatric History: Past Medical History  Diagnosis Date  . Asthma   . Polysubstance abuse     reports that he has been smoking Cigarettes.  He has been smoking about 1.00 pack per day. He has never used smokeless tobacco. He reports that he drinks alcohol. He reports that he uses illicit drugs (Methamphetamines). History reviewed. No pertinent family history.         Allergies:   Allergies  Allergen Reactions  . Ceclor [Cefaclor] Rash    ACT Assessment Complete:  Yes:    Educational Status    Risk to Self: Risk to self Is patient at risk for suicide?: Yes Substance abuse history and/or treatment for substance abuse?: No  Risk to Others:    Abuse:    Prior Inpatient Therapy:    Prior Outpatient Therapy:    Additional Information:      Objective: Blood pressure 107/76, pulse 92, temperature 97.6 F (36.4 C), temperature source Oral, resp. rate 20, SpO2 99.00%.There is no weight on file to calculate BMI. Results for orders placed during the hospital encounter of 06/13/14 (from the past 72  hour(s))  ACETAMINOPHEN LEVEL     Status: None   Collection Time    06/14/14 12:49 AM      Result Value Ref Range   Acetaminophen (Tylenol), Serum <15.0  10 - 30 ug/mL   Comment:            THERAPEUTIC CONCENTRATIONS VARY     SIGNIFICANTLY. A RANGE OF 10-30     ug/mL MAY BE AN EFFECTIVE     CONCENTRATION FOR MANY PATIENTS.     HOWEVER, SOME ARE BEST TREATED     AT CONCENTRATIONS OUTSIDE THIS     RANGE.     ACETAMINOPHEN CONCENTRATIONS     >150 ug/mL AT 4 HOURS AFTER     INGESTION AND >50 ug/mL AT 12     HOURS AFTER INGESTION ARE     OFTEN ASSOCIATED  WITH TOXIC     REACTIONS.  CBC     Status: None   Collection Time    06/14/14 12:49 AM      Result Value Ref Range   WBC 6.3  4.0 - 10.5 K/uL   RBC 4.73  4.22 - 5.81 MIL/uL   Hemoglobin 14.9  13.0 - 17.0 g/dL   Comment: DELTA CHECK NOTED     ISTAT   HCT 42.8  39.0 - 52.0 %   MCV 90.5  78.0 - 100.0 fL   MCH 31.5  26.0 - 34.0 pg   MCHC 34.8  30.0 - 36.0 g/dL   RDW 12.9  11.5 - 15.5 %   Platelets 168  150 - 400 K/uL   Comment: DELTA CHECK NOTED     REPEATED TO VERIFY     SPECIMEN CHECKED FOR CLOTS  COMPREHENSIVE METABOLIC PANEL     Status: Abnormal   Collection Time    06/14/14 12:49 AM      Result Value Ref Range   Sodium 141  137 - 147 mEq/L   Potassium 3.0 (*) 3.7 - 5.3 mEq/L   Comment: DELTA CHECK NOTED     REPEATED TO VERIFY   Chloride 103  96 - 112 mEq/L   CO2 22  19 - 32 mEq/L   Glucose, Bld 100 (*) 70 - 99 mg/dL   BUN 17  6 - 23 mg/dL   Creatinine, Ser 1.18  0.50 - 1.35 mg/dL   Calcium 8.9  8.4 - 10.5 mg/dL   Total Protein 6.7  6.0 - 8.3 g/dL   Albumin 4.1  3.5 - 5.2 g/dL   AST 42 (*) 0 - 37 U/L   ALT 27  0 - 53 U/L   Alkaline Phosphatase 74  39 - 117 U/L   Total Bilirubin 0.9  0.3 - 1.2 mg/dL   GFR calc non Af Amer 80 (*) >90 mL/min   GFR calc Af Amer >90  >90 mL/min   Comment: (NOTE)     The eGFR has been calculated using the CKD EPI equation.     This calculation has not been validated in all clinical situations.     eGFR's persistently <90 mL/min signify possible Chronic Kidney     Disease.  ETHANOL     Status: None   Collection Time    06/14/14 12:49 AM      Result Value Ref Range   Alcohol, Ethyl (B) <11  0 - 11 mg/dL   Comment:            LOWEST DETECTABLE LIMIT FOR     SERUM ALCOHOL IS 11  mg/dL     FOR MEDICAL PURPOSES ONLY  SALICYLATE LEVEL     Status: Abnormal   Collection Time    06/14/14 12:49 AM      Result Value Ref Range   Salicylate Lvl <1.4 (*) 2.8 - 20.0 mg/dL  URINE RAPID DRUG SCREEN (HOSP PERFORMED)     Status: Abnormal    Collection Time    06/14/14  1:28 AM      Result Value Ref Range   Opiates NONE DETECTED  NONE DETECTED   Cocaine NONE DETECTED  NONE DETECTED   Comment: DELTA CHECK NOTED     REPEATED TO VERIFY   Benzodiazepines NONE DETECTED  NONE DETECTED   Amphetamines POSITIVE (*) NONE DETECTED   Tetrahydrocannabinol POSITIVE (*) NONE DETECTED   Barbiturates NONE DETECTED  NONE DETECTED   Comment:            DRUG SCREEN FOR MEDICAL PURPOSES     ONLY.  IF CONFIRMATION IS NEEDED     FOR ANY PURPOSE, NOTIFY LAB     WITHIN 5 DAYS.                LOWEST DETECTABLE LIMITS     FOR URINE DRUG SCREEN     Drug Class       Cutoff (ng/mL)     Amphetamine      1000     Barbiturate      200     Benzodiazepine   970     Tricyclics       263     Opiates          300     Cocaine          300     THC              50   Labs are reviewed and are pertinent for cocaine, amphetamines and marijuana.   Current Facility-Administered Medications  Medication Dose Route Frequency Provider Last Rate Last Dose  . albuterol (PROVENTIL) (2.5 MG/3ML) 0.083% nebulizer solution 2.5 mg  2.5 mg Nebulization Q6H PRN Shea Stakes Crowford Tyler Deis., RPH      . ibuprofen (ADVIL,MOTRIN) tablet 600 mg  600 mg Oral Q8H PRN Ruthell Rummage Dammen, PA-C      . nicotine (NICODERM CQ - dosed in mg/24 hours) patch 21 mg  21 mg Transdermal Daily Peter S Dammen, PA-C      . ondansetron (ZOFRAN) tablet 4 mg  4 mg Oral Q8H PRN Ruthell Rummage Dammen, PA-C      . zolpidem (AMBIEN) tablet 5 mg  5 mg Oral QHS PRN Martie Lee, PA-C       Current Outpatient Prescriptions  Medication Sig Dispense Refill  . acetaminophen (TYLENOL) 500 MG tablet Take 1,000 mg by mouth every 6 (six) hours as needed for mild pain.      Marland Kitchen albuterol (PROVENTIL HFA;VENTOLIN HFA) 108 (90 BASE) MCG/ACT inhaler Inhale 1-2 puffs into the lungs every 6 (six) hours as needed for wheezing or shortness of breath.        Psychiatric Specialty Exam:     Blood pressure 107/76, pulse 92,  temperature 97.6 F (36.4 C), temperature source Oral, resp. rate 20, SpO2 99.00%.There is no weight on file to calculate BMI.  General Appearance: Casual  Eye Contact::  Good  Speech:  Clear and Coherent  Volume:  Normal  Mood:  Anxious  Affect:  Congruent  Thought Process:  Coherent and Logical  Orientation:  Full (Time, Place, and Person), yet intermittently disoriented, jittery, and startles easily  Thought Content:  Negative  Suicidal Thoughts:  No  Homicidal Thoughts:  No  Memory:  Immediate;   Good Recent;   Good Remote;   Good  Judgement:  Intact  Insight:  Fair  Psychomotor Activity:  Normal  Concentration:  Good  Recall:  Good  Fund of Knowledge:Good  Language: Good  Akathisia:  Negative  Handed:  Right  AIMS (if indicated):     Assets:  Communication Skills Desire for Improvement Financial Resources/Insurance Housing Intimacy Talents/Skills Transportation Vocational/Educational  Sleep:      Musculoskeletal: Strength & Muscle Tone: within normal limits Gait & Station: normal Patient leans: N/A  Treatment Plan Summary: -Hold in [facility] ED overnight for evaluation of detoxification from methamphetamines and other unknown drug substances. Consider discharge on Friday, 06/15/14 if pt continues to improve.   Benjamine Mola, FNP-BC 06/14/2014 2:14 PM

## 2014-06-14 NOTE — ED Notes (Signed)
Phlebotomy in room drawing blood  Tolerating well

## 2014-06-15 ENCOUNTER — Encounter (HOSPITAL_COMMUNITY): Payer: Self-pay

## 2014-06-15 ENCOUNTER — Encounter (HOSPITAL_COMMUNITY): Payer: Self-pay | Admitting: Psychiatry

## 2014-06-15 ENCOUNTER — Inpatient Hospital Stay (HOSPITAL_COMMUNITY)
Admission: AD | Admit: 2014-06-15 | Discharge: 2014-06-22 | DRG: 897 | Disposition: A | Discharge status: Home / Self Care | Payer: Federal, State, Local not specified - Other | Source: Intra-hospital | Attending: Psychiatry | Admitting: Psychiatry

## 2014-06-15 DIAGNOSIS — G4700 Insomnia, unspecified: Secondary | ICD-10-CM | POA: Diagnosis present

## 2014-06-15 DIAGNOSIS — F17200 Nicotine dependence, unspecified, uncomplicated: Secondary | ICD-10-CM | POA: Diagnosis present

## 2014-06-15 DIAGNOSIS — F411 Generalized anxiety disorder: Secondary | ICD-10-CM | POA: Diagnosis present

## 2014-06-15 DIAGNOSIS — F410 Panic disorder [episodic paroxysmal anxiety] without agoraphobia: Secondary | ICD-10-CM | POA: Diagnosis present

## 2014-06-15 DIAGNOSIS — F1920 Other psychoactive substance dependence, uncomplicated: Secondary | ICD-10-CM | POA: Diagnosis present

## 2014-06-15 DIAGNOSIS — F1410 Cocaine abuse, uncomplicated: Secondary | ICD-10-CM | POA: Diagnosis present

## 2014-06-15 DIAGNOSIS — F1994 Other psychoactive substance use, unspecified with psychoactive substance-induced mood disorder: Secondary | ICD-10-CM | POA: Diagnosis present

## 2014-06-15 DIAGNOSIS — J45909 Unspecified asthma, uncomplicated: Secondary | ICD-10-CM | POA: Diagnosis present

## 2014-06-15 DIAGNOSIS — F1520 Other stimulant dependence, uncomplicated: Principal | ICD-10-CM | POA: Diagnosis present

## 2014-06-15 DIAGNOSIS — F909 Attention-deficit hyperactivity disorder, unspecified type: Secondary | ICD-10-CM | POA: Diagnosis present

## 2014-06-15 DIAGNOSIS — F1210 Cannabis abuse, uncomplicated: Secondary | ICD-10-CM | POA: Diagnosis present

## 2014-06-15 DIAGNOSIS — F321 Major depressive disorder, single episode, moderate: Secondary | ICD-10-CM | POA: Diagnosis present

## 2014-06-15 DIAGNOSIS — F1910 Other psychoactive substance abuse, uncomplicated: Secondary | ICD-10-CM

## 2014-06-15 MED ORDER — HYDROXYZINE HCL 50 MG PO TABS
50.0000 mg | ORAL_TABLET | Freq: Three times a day (TID) | ORAL | Status: DC | PRN
  Administered 2014-06-15 – 2014-06-21 (×9): 50 mg via ORAL
  Filled 2014-06-15 (×2): qty 1
  Filled 2014-06-15: qty 30
  Filled 2014-06-15 (×7): qty 1

## 2014-06-15 MED ORDER — IBUPROFEN 600 MG PO TABS
600.0000 mg | ORAL_TABLET | Freq: Three times a day (TID) | ORAL | Status: DC | PRN
Start: 2014-06-15 — End: 2014-06-22
  Administered 2014-06-15: 600 mg via ORAL
  Filled 2014-06-15: qty 1

## 2014-06-15 MED ORDER — ALBUTEROL SULFATE HFA 108 (90 BASE) MCG/ACT IN AERS: 2.0000 | INHALATION_SPRAY | Freq: Four times a day (QID) | RESPIRATORY_TRACT | Status: DC | PRN

## 2014-06-15 MED ORDER — MAGNESIUM HYDROXIDE 400 MG/5ML PO SUSP: 30.0000 mL | Freq: Every day | ORAL | Status: DC | PRN

## 2014-06-15 MED ORDER — POTASSIUM CHLORIDE 20 MEQ/15ML (10%) PO LIQD
20.0000 meq | Freq: Two times a day (BID) | ORAL | Status: DC
  Administered 2014-06-15: 20 meq via ORAL
  Filled 2014-06-15 (×2): qty 15

## 2014-06-15 MED ORDER — POTASSIUM CHLORIDE 20 MEQ/15ML (10%) PO LIQD
20.0000 meq | Freq: Two times a day (BID) | ORAL | Status: DC
  Filled 2014-06-15 (×6): qty 15

## 2014-06-15 MED ORDER — HYDROXYZINE HCL 25 MG PO TABS
50.0000 mg | ORAL_TABLET | Freq: Three times a day (TID) | ORAL | Status: DC | PRN
  Administered 2014-06-15: 50 mg via ORAL
  Filled 2014-06-15: qty 2

## 2014-06-15 MED ORDER — ACETAMINOPHEN 325 MG PO TABS: 650.0000 mg | ORAL_TABLET | Freq: Four times a day (QID) | ORAL | Status: DC | PRN

## 2014-06-15 MED ORDER — ALUM & MAG HYDROXIDE-SIMETH 200-200-20 MG/5ML PO SUSP: 30.0000 mL | ORAL | Status: DC | PRN

## 2014-06-15 MED ORDER — HYDROXYZINE HCL 25 MG PO TABS: 25.0000 mg | ORAL_TABLET | Freq: Three times a day (TID) | ORAL | Status: DC | PRN

## 2014-06-15 MED ORDER — TRAZODONE HCL 100 MG PO TABS
100.0000 mg | ORAL_TABLET | Freq: Every day | ORAL | Status: DC
  Administered 2014-06-15: 100 mg via ORAL
  Filled 2014-06-15 (×5): qty 1

## 2014-06-15 MED ORDER — ONDANSETRON HCL 4 MG PO TABS: 4.0000 mg | ORAL_TABLET | Freq: Three times a day (TID) | ORAL | Status: DC | PRN

## 2014-06-15 NOTE — Consult Note (Signed)
[facility] Consult   Reason for Consult:  Substance Dependence and withdrawal  Referring Physician:  EDP SHEILA GERVASI is an 33 y.o. male. Total Time spent with patient: 20 minutes  Assessment: AXIS I:  poly substance abuse  AXIS II:  Deferred AXIS III:   Past Medical History  Diagnosis Date  . Asthma   . Polysubstance abuse    AXIS IV:  economic problems, housing problems, problems with access to health care services and problems with primary support group AXIS V:  21-30 behavior considerably influenced by delusions or hallucinations OR serious impairment in judgment, communication OR inability to function in almost all areas  Plan:  Recommend psychiatric Inpatient admission when medically cleared.  Dr. Lamar Benes assessed and is in agreement with current plan   Subjective:   ASAD KEEVEN is a 33 y.o. male patient admitted with poly substance abuse and withdrawal from methamphetamine, GHB   HPI: Patient is a 33 year old caucasian male who presents with poly substance dependence and a desire to detox.  Patient has a long history of drug use and failed recovery attempts.  States that he has burned bridges and feels that he can no longer live like this. "drugs are ruining my life and I need to stop before I kill myself"  Patient denies current suicidal ideation/homicidal ideation and requests assistance with obtaining sobriety.   HPI Elements:   Location:  generalized. Quality:  acute. Severity:  severe. Timing:  constant. Duration:  increasing in intensity in relation to increased stressors. Context:  loss of housing.  Past Psychiatric History: Past Medical History  Diagnosis Date  . Asthma   . Polysubstance abuse     reports that he has been smoking Cigarettes.  He has been smoking about 1.00 pack per day. He has never used smokeless tobacco. He reports that he drinks alcohol. He reports that he uses illicit drugs (Methamphetamines). History reviewed. No  pertinent family history.         Allergies:   Allergies  Allergen Reactions  . Ceclor [Cefaclor] Rash    ACT Assessment Complete:  Yes:    Educational Status    Risk to Self: Risk to self Is patient at risk for suicide?: Yes Substance abuse history and/or treatment for substance abuse?: Yes  Risk to Others:    Abuse:    Prior Inpatient Therapy:    Prior Outpatient Therapy:    Additional Information:                    Objective: Blood pressure 115/71, pulse 69, temperature 97.6 F (36.4 C), temperature source Oral, resp. rate 18, SpO2 97.00%.There is no weight on file to calculate BMI. Results for orders placed during the hospital encounter of 06/13/14 (from the past 72 hour(s))  ACETAMINOPHEN LEVEL     Status: None   Collection Time    06/14/14 12:49 AM      Result Value Ref Range   Acetaminophen (Tylenol), Serum <15.0  10 - 30 ug/mL   Comment:            THERAPEUTIC CONCENTRATIONS VARY     SIGNIFICANTLY. A RANGE OF 10-30     ug/mL MAY BE AN EFFECTIVE     CONCENTRATION FOR MANY PATIENTS.     HOWEVER, SOME ARE BEST TREATED     AT CONCENTRATIONS OUTSIDE THIS     RANGE.     ACETAMINOPHEN CONCENTRATIONS     >150 ug/mL AT 4 HOURS AFTER  INGESTION AND >50 ug/mL AT 12     HOURS AFTER INGESTION ARE     OFTEN ASSOCIATED WITH TOXIC     REACTIONS.  CBC     Status: None   Collection Time    06/14/14 12:49 AM      Result Value Ref Range   WBC 6.3  4.0 - 10.5 K/uL   RBC 4.73  4.22 - 5.81 MIL/uL   Hemoglobin 14.9  13.0 - 17.0 g/dL   Comment: DELTA CHECK NOTED     ISTAT   HCT 42.8  39.0 - 52.0 %   MCV 90.5  78.0 - 100.0 fL   MCH 31.5  26.0 - 34.0 pg   MCHC 34.8  30.0 - 36.0 g/dL   RDW 12.9  11.5 - 15.5 %   Platelets 168  150 - 400 K/uL   Comment: DELTA CHECK NOTED     REPEATED TO VERIFY     SPECIMEN CHECKED FOR CLOTS  COMPREHENSIVE METABOLIC PANEL     Status: Abnormal   Collection Time    06/14/14 12:49 AM      Result Value Ref Range   Sodium 141   137 - 147 mEq/L   Potassium 3.0 (*) 3.7 - 5.3 mEq/L   Comment: DELTA CHECK NOTED     REPEATED TO VERIFY   Chloride 103  96 - 112 mEq/L   CO2 22  19 - 32 mEq/L   Glucose, Bld 100 (*) 70 - 99 mg/dL   BUN 17  6 - 23 mg/dL   Creatinine, Ser 1.18  0.50 - 1.35 mg/dL   Calcium 8.9  8.4 - 10.5 mg/dL   Total Protein 6.7  6.0 - 8.3 g/dL   Albumin 4.1  3.5 - 5.2 g/dL   AST 42 (*) 0 - 37 U/L   ALT 27  0 - 53 U/L   Alkaline Phosphatase 74  39 - 117 U/L   Total Bilirubin 0.9  0.3 - 1.2 mg/dL   GFR calc non Af Amer 80 (*) >90 mL/min   GFR calc Af Amer >90  >90 mL/min   Comment: (NOTE)     The eGFR has been calculated using the CKD EPI equation.     This calculation has not been validated in all clinical situations.     eGFR's persistently <90 mL/min signify possible Chronic Kidney     Disease.  ETHANOL     Status: None   Collection Time    06/14/14 12:49 AM      Result Value Ref Range   Alcohol, Ethyl (B) <11  0 - 11 mg/dL   Comment:            LOWEST DETECTABLE LIMIT FOR     SERUM ALCOHOL IS 11 mg/dL     FOR MEDICAL PURPOSES ONLY  SALICYLATE LEVEL     Status: Abnormal   Collection Time    06/14/14 12:49 AM      Result Value Ref Range   Salicylate Lvl <4.0 (*) 2.8 - 20.0 mg/dL  URINE RAPID DRUG SCREEN (HOSP PERFORMED)     Status: Abnormal   Collection Time    06/14/14  1:28 AM      Result Value Ref Range   Opiates NONE DETECTED  NONE DETECTED   Cocaine NONE DETECTED  NONE DETECTED   Comment: DELTA CHECK NOTED     REPEATED TO VERIFY   Benzodiazepines NONE DETECTED  NONE DETECTED   Amphetamines POSITIVE (*) NONE DETECTED  Tetrahydrocannabinol POSITIVE (*) NONE DETECTED   Barbiturates NONE DETECTED  NONE DETECTED   Comment:            DRUG SCREEN FOR MEDICAL PURPOSES     ONLY.  IF CONFIRMATION IS NEEDED     FOR ANY PURPOSE, NOTIFY LAB     WITHIN 5 DAYS.                LOWEST DETECTABLE LIMITS     FOR URINE DRUG SCREEN     Drug Class       Cutoff (ng/mL)     Amphetamine       1000     Barbiturate      200     Benzodiazepine   220     Tricyclics       254     Opiates          300     Cocaine          300     THC              50   Labs are reviewed and are pertinent for amphetamines and THC.  Current Facility-Administered Medications  Medication Dose Route Frequency Provider Last Rate Last Dose  . albuterol (PROVENTIL) (2.5 MG/3ML) 0.083% nebulizer solution 2.5 mg  2.5 mg Nebulization Q6H PRN Shea Stakes Crowford Tyler Deis., RPH      . hydrOXYzine (ATARAX/VISTARIL) tablet 50 mg  50 mg Oral TID PRN Waylan Boga, NP   50 mg at 06/15/14 0913  . ibuprofen (ADVIL,MOTRIN) tablet 600 mg  600 mg Oral Q8H PRN Ruthell Rummage Dammen, PA-C      . ondansetron [facility]) tablet 4 mg  4 mg Oral Q8H PRN Ruthell Rummage Dammen, PA-C      . potassium chloride 20 MEQ/15ML (10%) liquid 20 mEq  20 mEq Oral BID Waylan Boga, NP   20 mEq at 06/15/14 2706  . zolpidem (AMBIEN) tablet 5 mg  5 mg Oral QHS PRN Martie Lee, PA-C       Current Outpatient Prescriptions  Medication Sig Dispense Refill  . acetaminophen (TYLENOL) 500 MG tablet Take 1,000 mg by mouth every 6 (six) hours as needed for mild pain.      Marland Kitchen albuterol (PROVENTIL HFA;VENTOLIN HFA) 108 (90 BASE) MCG/ACT inhaler Inhale 1-2 puffs into the lungs every 6 (six) hours as needed for wheezing or shortness of breath.        Psychiatric Specialty Exam:     Blood pressure 115/71, pulse 69, temperature 97.6 F (36.4 C), temperature source Oral, resp. rate 18, SpO2 97.00%.There is no weight on file to calculate BMI.  General Appearance: Disheveled  Eye Sport and exercise psychologist::  Fair  Speech:  Clear and Coherent  Volume:  Normal  Mood:  Depressed  Affect:  Congruent  Thought Process:  Coherent, Goal Directed and Linear  Orientation:  Full (Time, Place, and Person)  Thought Content:  WDL  Suicidal Thoughts:  No  Homicidal Thoughts:  No  Memory:  Immediate;   Good Recent;   Good Remote;   Good  Judgement:  Fair  Insight:  Fair  Psychomotor Activity:   Normal  Concentration:  Fair  Recall:  [place] of Knowledge:Good  Language: Good  Akathisia:  No  Handed:  Right  AIMS (if indicated):     Assets:  Communication Skills Desire for Improvement Resilience  Sleep:      Musculoskeletal: Strength & Muscle Tone: within normal limits Gait &  Station: normal Patient leans: N/A  Treatment Plan Summary: Daily contact with patient to assess and evaluate symptoms and progress in treatment Medication management  Admit to inpatient psychiatric for detoxification and stabilization of mood.   Waylan Boga  PMH-NP 06/15/2014 2:59 PM  Patient is seen face to face for psychiatric evaluation along with physician extender, case discussed after clinical rounds and formulated appropriate treatment plan. Reviewed the information documented and agree with the treatment plan.  JONNALAGADDA,JANARDHAHA R. 07/05/2014 9:58 AM

## 2014-06-15 NOTE — Tx Team (Signed)
Initial Interdisciplinary Treatment Plan  PATIENT STRENGTHS: (choose at least two) Average or above average intelligence Capable of independent living Motivation for treatment/growth Physical Health Religious Affiliation Supportive family/friends  PATIENT STRESSORS: Financial difficulties Legal issue Substance abuse   PROBLEM LIST: Problem List/Patient Goals Date to be addressed Date deferred Reason deferred Estimated date of resolution  Polysubstance Abuse 06/15/2014     D/C  Depression 06/15/2014    D/C  Anxiety 06/15/2014    D/C                                       DISCHARGE CRITERIA:  Ability to meet basic life and health needs Improved stabilization in mood, thinking, and/or behavior Motivation to continue treatment in a less acute level of care Reduction of life-threatening or endangering symptoms to within safe limits Safe-care adequate arrangements made Verbal commitment to aftercare and medication compliance Withdrawal symptoms are absent or subacute and managed without 24-hour nursing intervention  PRELIMINARY DISCHARGE PLAN: [facility]  PATIENT/FAMIILY INVOLVEMENT: This treatment plan has been presented to and reviewed with the patient, Rosealee Albeehomas S Kosel.  The patient and family have been given the opportunity to ask questions and make suggestions.  PLACKE, DAWN 06/15/2014, 6:10 PM

## 2014-06-15 NOTE — Progress Notes (Addendum)
[facility]. CSW spoke with [facility] and they unfortunately do not detox crystal meth and no treatment beds available. [facility] encouraged patient to call back on Monday at 930 to check for treatment bed. Pt able to contract for safety with CSW, denies SI/HI/AH/VH.  Pt does share that he feels that he may use again if discharged. CSW will discuss with treatment team.   Olga CoasterKristen Reed, LCSW [phone]  ED CSW ,06/15/2014 1105am
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
## 2014-06-16 DIAGNOSIS — F1994 Other psychoactive substance use, unspecified with psychoactive substance-induced mood disorder: Secondary | ICD-10-CM | POA: Diagnosis present

## 2014-06-16 DIAGNOSIS — F1520 Other stimulant dependence, uncomplicated: Secondary | ICD-10-CM | POA: Diagnosis present

## 2014-06-16 MED ORDER — LORAZEPAM 1 MG PO TABS
1.0000 mg | ORAL_TABLET | Freq: Four times a day (QID) | ORAL | Status: DC | PRN
  Administered 2014-06-16 – 2014-06-18 (×3): 1 mg via ORAL
  Filled 2014-06-16 (×3): qty 1

## 2014-06-16 MED ORDER — POTASSIUM CHLORIDE CRYS ER 20 MEQ PO TBCR
20.0000 meq | EXTENDED_RELEASE_TABLET | Freq: Two times a day (BID) | ORAL | Status: AC
  Administered 2014-06-16 – 2014-06-18 (×6): 20 meq via ORAL
  Filled 2014-06-16 (×2): qty 1
  Filled 2014-06-16: qty 2
  Filled 2014-06-16 (×4): qty 1

## 2014-06-16 NOTE — Progress Notes (Signed)
Pt resting in bed all evening. Refused AA group tonight as he has all day. Pt lethargic, sleepy during our conversation and forwards little information. Denies any sort of physical complaints other than some minor joint pain. Supported and reassured. Encouraged to attend groups tomorrow. He denies SI/HI and remains safe at this time. Lawrence MarseillesFriedman, Marian Eakes
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
## 2014-06-16 NOTE — Progress Notes (Signed)
Pt has been in bed most of the day.  He was given prn vistaril at 0846 and then ativan 1 mg at 0927.  He reported his sleep poor, appetite poor, energy low, and ability to pay attention poor.  He rated his depression a 9 hopelessness a 5 and anxiety a 9 on his self-inventory.  He denies any S/H ideation or A/V/H.  He c/o tremors, cravings and agitation. He has not been to groups but has been up for "free time" watching tv in dayroom with his peers, for snacks and meals. He is unclear of his d/c plan.

## 2014-06-16 NOTE — Progress Notes (Signed)
Patient did not attend the evening speaker AA meeting. Pt was notified that group was beginning but remained in bed.   

## 2014-06-16 NOTE — [facility] Group Notes (Signed)
0900 nursing orientation group    The focus of this group is to educate the patient on the purpose and policies of crisis stabilization and provide a format to answer questions about their admission.  The group details unit policies and expectations of patients while admitted.   Pt did not attend group he was in his bed and refused to come to group.

## 2014-06-16 NOTE — [facility] Group Notes (Signed)
[facility] Group Notes: (Clinical Social Work)   06/16/2014      Type of Therapy:  Group Therapy   Participation Level:  Did Not Attend    Ambrose MantleMareida Grossman-Orr, LCSW 06/16/2014, 12:39 PM

## 2014-06-17 DIAGNOSIS — F411 Generalized anxiety disorder: Secondary | ICD-10-CM

## 2014-06-17 MED ORDER — MIRTAZAPINE 30 MG PO TABS
30.0000 mg | ORAL_TABLET | Freq: Every day | ORAL | Status: DC
  Administered 2014-06-17 – 2014-06-18 (×2): 30 mg via ORAL
  Filled 2014-06-17 (×4): qty 1

## 2014-06-17 NOTE — Progress Notes (Signed)
Pt up on unit this evening though he did not attend AA. Pt attended 2 of 5 groups today. He presents with a flat affect, mood depressed. Forwards very little information 1:1 or otherwise. Does deny and physical problems related to withdrawal. No pain. Medicated per orders. Education given regarding new order of remeron and pt verbalizes understanding. Appreciative for the change as the trazadone gave him bad nightmares. He denies SI/HI and remains safe. Kyle MarseillesFriedman, Marian Eakes

## 2014-06-17 NOTE — Progress Notes (Signed)
[facility]  06/17/2014 12:24 PM Rosealee Albeehomas S Fedewa  MRN:  [id number] Subjective:  States he has not been able to sleep, to rest. He endorsed a lot of anxiety and worry. Admits he cant continue to go on like this. States he wants to pursue a residential treatment center. Concerned about his relapses and what he is doing to his brain.  Diagnosis:   DSM5: Schizophrenia Disorders:  none Obsessive-Compulsive Disorders:  none Trauma-Stressor Disorders:  none Substance/Addictive Disorders:  Methamphetamine Dependence Depressive Disorders:  Major Depressive Disorder - Moderate (296.22) Total Time spent with patient: 30 minutes  Axis I: Anxiety Disorder NOS and Substance Induced Mood Disorder  ADL's:  Intact  Sleep: Poor  Appetite:  Fair  Suicidal Ideation:  Plan:  denies Intent:  denies Means:  denies Homicidal Ideation:  Plan:  denies Intent:  denies Means:  denies AEB (as evidenced by):  Psychiatric Specialty Exam: Physical Exam  Review of Systems  Constitutional: Positive for malaise/fatigue.  HENT: Negative.   Eyes: Negative.   Respiratory: Negative.   Cardiovascular: Negative.   Gastrointestinal: Negative.   Genitourinary: Negative.   Musculoskeletal: Negative.   Skin: Negative.   Neurological: Positive for weakness.  Endo/Heme/Allergies: Negative.   Psychiatric/Behavioral: Positive for substance abuse. The patient is nervous/anxious and has insomnia.     Blood pressure 130/77, pulse 77, temperature 97.4 F (36.3 C), temperature source Oral, resp. rate 16, height 5\' 9"  (1.753 m), weight 70.761 kg (156 lb), SpO2 99.00%.Body mass index is 23.03 kg/(m^2).  General Appearance: Fairly Groomed  Patent attorneyye Contact::  Fair  Speech:  Clear and Coherent and not spontaneous  Volume:  Decreased  Mood:  Anxious and Depressed  Affect:  Restricted  Thought Process:  Coherent and Goal Directed  Orientation:  Full (Time, Place, and Person)  Thought Content:  symtpoms, worries,  concerns  Suicidal Thoughts:  No  Homicidal Thoughts:  No  Memory:  Immediate;   Fair Recent;   Fair Remote;   Fair  Judgement:  Fair  Insight:  Present and Shallow  Psychomotor Activity:  Restlessness  Concentration:  Fair  Recall:  [name]  Fund of Knowledge:NA  Language: Fair  Akathisia:  No  Handed:    AIMS (if indicated):     Assets:  Desire for Improvement Talents/Skills Vocational/Educational  Sleep:  Number of Hours: 5.75   Musculoskeletal: Strength & Muscle Tone: within normal limits Gait & Station: normal Patient leans: N/A  Current Medications: Current Facility-Administered Medications  Medication Dose Route Frequency Provider Last Rate Last Dose  . acetaminophen (TYLENOL) tablet 650 mg  650 mg Oral Q6H PRN Nanine MeansJamison Lord, NP      . albuterol (PROVENTIL HFA;VENTOLIN HFA) 108 (90 BASE) MCG/ACT inhaler 2 puff  2 puff Inhalation Q6H PRN Nanine MeansJamison Lord, NP      . alum & mag hydroxide-simeth (MAALOX/MYLANTA) 200-200-20 MG/5ML suspension 30 mL  30 mL Oral Q4H PRN Nanine MeansJamison Lord, NP      . hydrOXYzine (ATARAX/VISTARIL) tablet 50 mg  50 mg Oral TID PRN Nanine MeansJamison Lord, NP   50 mg at 06/16/14 0846  . ibuprofen (ADVIL,MOTRIN) tablet 600 mg  600 mg Oral Q8H PRN Nanine MeansJamison Lord, NP   600 mg at 06/15/14 2158  . LORazepam (ATIVAN) tablet 1 mg  1 mg Oral Q6H PRN Rachael FeeIrving A Lugo, MD   1 mg at 06/17/14 0804  . magnesium hydroxide (MILK OF MAGNESIA) suspension 30 mL  30 mL Oral Daily PRN Nanine MeansJamison Lord, NP      .  ondansetron (ZOFRAN) tablet 4 mg  4 mg Oral Q8H PRN Nanine MeansJamison Lord, NP      . potassium chloride SA (K-DUR,KLOR-CON) CR tablet 20 mEq  20 mEq Oral BID Rachael FeeIrving A Lugo, MD   20 mEq at 06/17/14 0804  . traZODone (DESYREL) tablet 100 mg  100 mg Oral QHS Nanine MeansJamison Lord, NP   100 mg at 06/15/14 2158    Lab Results: No results found for this or any previous visit (from the past 48 hour(s)).  Physical Findings: AIMS: Facial and Oral Movements Muscles of Facial Expression: None, normal Lips and  Perioral Area: None, normal Jaw: None, normal Tongue: None, normal,Extremity Movements Upper (arms, wrists, hands, fingers): None, normal Lower (legs, knees, ankles, toes): None, normal, Trunk Movements Neck, shoulders, hips: None, normal, Overall Severity Severity of abnormal movements (highest score from questions above): None, normal Incapacitation due to abnormal movements: None, normal Patient's awareness of abnormal movements (rate only patient's report): No Awareness, Dental Status Current problems with teeth and/or dentures?: No Does patient usually wear dentures?: No  CIWA:  CIWA-Ar Total: 7 COWS:  COWS Total Score: 9  Treatment Plan Summary: Daily contact with patient to assess and evaluate symptoms and progress in treatment Medication management  Plan: Supportive approach/coping skills/relapse prevention           D/C Trazodone, Trial with Remeron 30 mg HS vs. Seroquel           Continue Ativan 1 Mg PRN   Medical Decision Making Problem Points:  Established problem, worsening (2) and Review of psycho-social stressors (1) Data Points:  Review of medication regiment & side effects (2) Review of new medications or change in dosage (2)  I certify that inpatient services furnished can reasonably be expected to improve the patient's condition.   LUGO,IRVING A 06/17/2014, 12:24 PM

## 2014-06-17 NOTE — [facility] Group Notes (Signed)
[facility] Group Notes:  (Nursing/MHT/Case Management/Adjunct)  Date:  06/17/2014  Time:  1:07 PM  Type of Therapy:  Psychoeducational Skills  Participation Level:  Did Not Attend  Kyle Wilkins 06/17/2014, 1:07 PM 

## 2014-06-17 NOTE — Plan of Care (Signed)
Problem: Alteration in mood & ability to function due to Goal: STG-Patient will report withdrawal symptoms Outcome: Progressing Pt denying any symptoms of withdrawal and VSS.  Problem: Alteration in mood Goal: STG-Patient reports thoughts of self-harm to staff Outcome: Progressing Pt denying SI and no acts of self harm on unit.

## 2014-06-17 NOTE — [facility] Group Notes (Signed)
[facility] Group Notes:  (Nursing/MHT/Case Management/Adjunct)  Date:  06/17/2014  Time:  2:17 PM  Type of Therapy:  Psychoeducational Skills  Participation Level:  Active  Participation Quality:  Appropriate  Affect:  Appropriate  Cognitive:  Appropriate  Insight:  Appropriate  Engagement in Group:  Engaged  Modes of Intervention:  Discussion  Summary of Progress/Problems: Pt did attend healthy supports systems group, pt reported that his aunt as his support system. Pt also reported that [facility], or [facility].      Jacquelyne BalintForrest, Shalita Shanta 06/17/2014, 2:17 PM

## 2014-06-17 NOTE — Progress Notes (Signed)
Patient did not attend the evening speaker AA meeting. Pt was notified that group was beginning but remained in bed.   

## 2014-06-17 NOTE — Progress Notes (Signed)
Pt has been up and showered.  He rated his sleep as poor and energy low. He has not been participating in groups but will go to dayroom during snacks, go down to cafeteria for meals, and down time with his peers.  He rated his depression a 4 denies ant hopelessness and rated his anxiety a 2 on his self-inventory.  He denies any S/I or A/H/H. He only reports agitation from his detox denies any other symptoms. He is hoping to get into [facility] or [facility] for in-patient treatment.

## 2014-06-17 NOTE — Progress Notes (Signed)
Adult Psychoeducational Group Note  Date:  06/17/2014 Time:  3:15PM  Group Topic/Focus:  Personal Choices and Values:   The focus of this group is to help patients assess and explore the importance of values in their lives, how their values affect their decisions, how they express their values and what opposes their expression.  Participation Level:  Active  Participation Quality:  Appropriate  Affect:  Appropriate  Cognitive:  Appropriate  Insight: Appropriate  Engagement in Group:  Engaged  Modes of Intervention:  Activity and Discussion  Additional Comments:  Pt participated in the group activity and was active throughout group  LEA, JANAY K 06/17/2014, 4:07 PM

## 2014-06-17 NOTE — [facility] Group Notes (Signed)
[facility] Group Notes: (Clinical Social Work)   06/17/2014      Type of Therapy:  Group Therapy   Participation Level:  Did Not Attend    Kyle MantleMareida Grossman-Orr, LCSW 06/17/2014, 12:42 PM

## 2014-06-18 DIAGNOSIS — F909 Attention-deficit hyperactivity disorder, unspecified type: Secondary | ICD-10-CM | POA: Diagnosis present

## 2014-06-18 MED ORDER — QUETIAPINE FUMARATE 100 MG PO TABS
100.0000 mg | ORAL_TABLET | Freq: Every day | ORAL | Status: DC
  Administered 2014-06-18: 100 mg via ORAL
  Filled 2014-06-18 (×2): qty 1

## 2014-06-18 NOTE — [facility] Suicide Risk Assessment (Signed)
[facility] INPATIENT: Family/Significant Other Suicide Prevention Education  Suicide Prevention Education:  Education Completed; No one has been identified by the patient as the family member/significant other with whom the patient will be residing, and identified as the person(s) who will aid the patient in the event of a mental health crisis (suicidal ideations/suicide attempt).   Pt did not c/o SI at admission, nor have they endorsed SI during their stay here. SPE not required. SPI pamphlet provided to pt and he was encouraged to share information with support network, ask questions, and talk about any concerns relating to SPE.  The Sherwin-WilliamsHeather Smart, LCSWA 06/18/2014 11:06 AM

## 2014-06-18 NOTE — [facility] Group Notes (Signed)
[facility] LCSW Group Therapy  06/18/2014 3:18 PM  Type of Therapy:  Group Therapy  Participation Level:  Minimal  Participation Quality:  Drowsy  Affect:  Flat and Lethargic  Cognitive:  Lacking  Insight:  Limited  Engagement in Therapy:  Limited  Modes of Intervention:  Confrontation, Discussion, Education, Exploration, Problem-solving, Rapport Building, Socialization and Support  Summary of Progress/Problems: Today's Topic: Overcoming Obstacles. Pt identified obstacles faced currently and processed barriers involved in overcoming these obstacles. Pt identified steps necessary for overcoming these obstacles and explored motivation (internal and external) for facing these difficulties head on. Pt further identified one area of concern in their lives and chose a skill of focus pulled from their "toolbox." Kyle Wilkins was drowsy throughout group but participated in group discussion when called upon by CSW. He shared that his biggest obstacle involves getting in touch with his father and arranging a move to [place] while he is in treatment. Kyle Wilkins shows progress in the group setting and improving insight AEB his ability to process how "looking at what worked to keep me sober in the past" can help him identify what he needs to do during  Recovery at this point. "I always do better when I am away from certain people and places in [place]. [place] and [place] with my father who is a good support for me."    Smart, Lebron QuamHeather LCSWA  06/18/2014, 3:18 PM

## 2014-06-18 NOTE — Progress Notes (Signed)
[facility]  06/18/2014 1:41 PM Kyle Wilkins  MRN:  [id number] Subjective:  Kyle Wilkins continues to have a hard time. States that he cant sleep. His mind is all over the place. States he has ADHD and without stimulants he cant function. He states he has never abused Adderall. Claims that when he had come to the ED before he has told them he was abusing Adderall and not disclose that it was really Methamphetamine what he was abusing. Did not want people to know. He states he is concerned about his situation and the guys that are looking for him. States this is real and is not delusional. States that they get inside his place and move things around. States that he asked one of his friends who is a Warden/rangerpsychologist after giving him all the information about what has been going on and both finding the windows in his house all open when they went back there, told him he believed all this was real. And that he needed to be careful. He is hoping to be able to leave the area and go with his father who [place] to [place] shortly. States on again that he is not paranoid that this is all real Diagnosis:   DSM5: Schizophrenia Disorders:  none Obsessive-Compulsive Disorders:  none Trauma-Stressor Disorders:  none Substance/Addictive Disorders:  Methamphetamine dependence Depressive Disorders:  Major Depressive Disorder - Moderate (296.22) Total Time spent with patient: 30 minutes  Axis I: Substance Induced Mood Disorder  ADL's:  Intact  Sleep: Poor  Appetite:  Fair  Suicidal Ideation:  Plan:  denies Intent:  denies Means:  denies Homicidal Ideation:  Plan:  denies Intent:  denies Means:  denies AEB (as evidenced by):  Psychiatric Specialty Exam: Physical Exam  Review of Systems  Constitutional: Positive for malaise/fatigue.  HENT: Negative.   Eyes: Negative.   Respiratory: Negative.   Cardiovascular: Negative.   Gastrointestinal: Negative.   Genitourinary: Negative.    Musculoskeletal: Negative.   Skin: Negative.   Neurological: Positive for weakness.  Endo/Heme/Allergies: Negative.   Psychiatric/Behavioral: Positive for substance abuse. The patient is nervous/anxious and has insomnia.     Blood pressure 134/96, pulse 64, temperature 96.8 F (36 C), temperature source Oral, resp. rate 24, height 5\' 9"  (1.753 m), weight 70.761 kg (156 lb), SpO2 99.00%.Body mass index is 23.03 kg/(m^2).  General Appearance: Fairly Groomed  Patent attorneyye Contact::  Fair  Speech:  Clear and Coherent  Volume:  fluctates  Mood:  Anxious and worried  Affect:  anxious, worried  Thought Process:  Coherent and Goal Directed  Orientation:  Full (Time, Place, and Person)  Thought Content:  Symptoms, worries, concerns, R/O underlying paranoid ideation  Suicidal Thoughts:  No  Homicidal Thoughts:  No  Memory:  Immediate;   Fair Recent;   Fair Remote;   Fair  Judgement:  Fair  Insight:  Shallow  Psychomotor Activity:  Restlessness  Concentration:  Poor  Recall:  [name]  Fund of Knowledge:NA  Language: Fair  Akathisia:  No  Handed:    AIMS (if indicated):     Assets:  Desire for Improvement Vocational/Educational  Sleep:  Number of Hours: 6   Musculoskeletal: Strength & Muscle Tone: within normal limits Gait & Station: normal Patient leans: N/A  Current Medications: Current Facility-Administered Medications  Medication Dose Route Frequency Provider Last Rate Last Dose  . acetaminophen (TYLENOL) tablet 650 mg  650 mg Oral Q6H PRN Nanine MeansJamison Lord, NP      . albuterol (  PROVENTIL HFA;VENTOLIN HFA) 108 (90 BASE) MCG/ACT inhaler 2 puff  2 puff Inhalation Q6H PRN Nanine MeansJamison Lord, NP      . alum & mag hydroxide-simeth (MAALOX/MYLANTA) 200-200-20 MG/5ML suspension 30 mL  30 mL Oral Q4H PRN Nanine MeansJamison Lord, NP      . hydrOXYzine (ATARAX/VISTARIL) tablet 50 mg  50 mg Oral TID PRN Nanine MeansJamison Lord, NP   50 mg at 06/16/14 0846  . ibuprofen (ADVIL,MOTRIN) tablet 600 mg  600 mg Oral Q8H PRN Nanine MeansJamison  Lord, NP   600 mg at 06/15/14 2158  . LORazepam (ATIVAN) tablet 1 mg  1 mg Oral Q6H PRN Rachael FeeIrving A Kelleen Stolze, MD   1 mg at 06/18/14 0836  . magnesium hydroxide (MILK OF MAGNESIA) suspension 30 mL  30 mL Oral Daily PRN Nanine MeansJamison Lord, NP      . mirtazapine (REMERON) tablet 30 mg  30 mg Oral QHS Rachael FeeIrving A Betha Shadix, MD   30 mg at 06/17/14 2137  . ondansetron (ZOFRAN) tablet 4 mg  4 mg Oral Q8H PRN Nanine MeansJamison Lord, NP      . potassium chloride SA (K-DUR,KLOR-CON) CR tablet 20 mEq  20 mEq Oral BID Rachael FeeIrving A Larnell Granlund, MD   20 mEq at 06/18/14 0834  . QUEtiapine (SEROQUEL) tablet 100 mg  100 mg Oral QHS Rachael FeeIrving A Kaitlinn Iversen, MD        Lab Results: No results found for this or any previous visit (from the past 48 hour(s)).  Physical Findings: AIMS: Facial and Oral Movements Muscles of Facial Expression: None, normal Lips and Perioral Area: None, normal Jaw: None, normal Tongue: None, normal,Extremity Movements Upper (arms, wrists, hands, fingers): None, normal Lower (legs, knees, ankles, toes): None, normal, Trunk Movements Neck, shoulders, hips: None, normal, Overall Severity Severity of abnormal movements (highest score from questions above): None, normal Incapacitation due to abnormal movements: None, normal Patient's awareness of abnormal movements (rate only patient's report): No Awareness, Dental Status Current problems with teeth and/or dentures?: No Does patient usually wear dentures?: No  CIWA:  CIWA-Ar Total: 7 COWS:  COWS Total Score: 9  Treatment Plan Summary: Daily contact with patient to assess and evaluate symptoms and progress in treatment Medication management  Plan: Supportive approach/coping skills/relapse prevention           Evaluate further           Reassess and address the co morbidities           Trial with Seroquel 100 mg HS Medical Decision Making Problem Points:  Review of psycho-social stressors (1) Data Points:  Review of medication regiment & side effects (2) Review of new medications  or change in dosage (2)  I certify that inpatient services furnished can reasonably be expected to improve the patient's condition.   Jen Benedict A 06/18/2014, 1:41 PM

## 2014-06-18 NOTE — Progress Notes (Signed)
Patient ID: Kyle Wilkins, male   DOB: 03-13-1981, 33 y.o.   MRN: [id number] D- Patient reports poor sleep due to roomate's snoring.  His appetite is improving and his energy level is low.  He is rating depression and hopelessness at 4/10.  He denies SI.  He would like to continue treatment after discharge,  A- supported patient.  R- patient is attending groups and interacting with peers.

## 2014-06-18 NOTE — Progress Notes (Signed)
Adult Psychoeducational Group Note  Date:  06/18/2014 Time:  10:00 am  Group Topic/Focus:  Wellness Toolbox:   The focus of this group is to discuss various aspects of wellness, balancing those aspects and exploring ways to increase the ability to experience wellness.  Patients will create a wellness toolbox for use upon discharge.  Participation Level:  Active  Participation Quality:  Appropriate, Sharing and Supportive  Affect:  Appropriate  Cognitive:  Appropriate  Insight: Appropriate  Engagement in Group:  Engaged  Modes of Intervention:  Discussion, Education, Socialization and Support  Additional Comments:  Pt stated that he can exercise and go to church to assist him with making progress towards living more healthy. Pt stated that drugs and grief have been barriers to his progress.   Laural BenesJohnson, [place] 06/18/2014, 10:57 AM

## 2014-06-18 NOTE — [facility] Group Notes (Signed)
[facility]   06/18/2014 10:23 AM  Participation Quality:  Appropriate   Mood/Affect:  Appropriate  Depression Rating:  5  Anxiety Rating:  5  Thoughts of Suicide:  No Will you contract for safety?   NA  Current AVH:  No  Plan for Discharge/Comments:  Pt reports [place]. "My boss is trying to get me into treatment but I want an [facility] referral please." CSW assessing. [facility] referral sent this morning per pt request. Pt stated that he has mild withdrawals and is sleeping "okay." Pt reports one month ago, he relapsed on meth after clean for 4 months.   Transportation Means: friend/family   Supports: Tour managerfriend/family/boss   Smart, [name]

## 2014-06-19 MED ORDER — GABAPENTIN 300 MG PO CAPS
300.0000 mg | ORAL_CAPSULE | Freq: Once | ORAL | Status: AC
  Administered 2014-06-19: 300 mg via ORAL

## 2014-06-19 MED ORDER — QUETIAPINE FUMARATE 50 MG PO TABS
50.0000 mg | ORAL_TABLET | Freq: Every day | ORAL | Status: DC
  Administered 2014-06-19: 50 mg via ORAL
  Filled 2014-06-19 (×4): qty 1

## 2014-06-19 NOTE — Progress Notes (Addendum)
[facility]  06/19/2014 5:29 PM Kyle Wilkins  MRN:  [id number] Subjective:  Kyle Wilkins states that he is upset as [facility] did not consider that the degree of his addiction would not meet their criteria for admission. State he is concerned as does not think he can afford another relapse. He continues to endorse anxiety. States he did not sleep that well last night with the Remeron and thinks it might be causing some restlessness.  Diagnosis:   DSM5: Schizophrenia Disorders:  none Obsessive-Compulsive Disorders:  none Trauma-Stressor Disorders:  none Substance/Addictive Disorders:  Methamphetamine Dependence Depressive Disorders:  Major Depressive Disorder - Moderate (296.22) Total Time spent with patient: 30 minutes  Axis I: Substance Induced Mood Disorder  ADL's:  Intact  Sleep: Poor  Appetite:  Fair  Suicidal Ideation:  Plan:  denies Intent:  denies Means:  denies Homicidal Ideation:  Plan:  denies Intent:  denies Means:  denies AEB (as evidenced by):  Psychiatric Specialty Exam: Physical Exam  Review of Systems  Constitutional: Positive for malaise/fatigue.  HENT: Negative.   Eyes: Negative.   Respiratory: Negative.   Cardiovascular: Negative.   Gastrointestinal: Negative.   Genitourinary: Negative.   Musculoskeletal: Negative.   Skin: Negative.   Neurological: Positive for weakness.  Endo/Heme/Allergies: Negative.   Psychiatric/Behavioral: Positive for substance abuse. The patient is nervous/anxious and has insomnia.     Blood pressure 128/86, pulse 70, temperature 96.8 F (36 C), temperature source Oral, resp. rate 16, height 5\' 9"  (1.753 m), weight 70.761 kg (156 lb), SpO2 99.00%.Body mass index is 23.03 kg/(m^2).  General Appearance: Fairly Groomed  Patent attorneyye Contact::  Fair  Speech:  Clear and Coherent  Volume:  Normal  Mood:  Anxious and worried  Affect:  anxious, worried  Thought Process:  Coherent and Goal Directed  Orientation:  Full (Time, Place, and  Person)  Thought Content:  symptoms, worries, concerns about relapsing  Suicidal Thoughts:  No  Homicidal Thoughts:  No  Memory:  Immediate;   Fair Recent;   Fair Remote;   Fair  Judgement:  Fair  Insight:  Present  Psychomotor Activity:  Restlessness  Concentration:  Fair  Recall:  [name]  Fund of Knowledge:NA  Language: Fair  Akathisia:  No  Handed:    AIMS (if indicated):     Assets:  Desire for Improvement  Sleep:  Number of Hours: 6.5   Musculoskeletal: Strength & Muscle Tone: within normal limits Gait & Station: normal Patient leans: N/A  Current Medications: Current Facility-Administered Medications  Medication Dose Route Frequency Taneia Mealor Last Rate Last Dose  . acetaminophen (TYLENOL) tablet 650 mg  650 mg Oral Q6H PRN Nanine MeansJamison Lord, NP      . albuterol (PROVENTIL HFA;VENTOLIN HFA) 108 (90 BASE) MCG/ACT inhaler 2 puff  2 puff Inhalation Q6H PRN Nanine MeansJamison Lord, NP      . alum & mag hydroxide-simeth (MAALOX/MYLANTA) 200-200-20 MG/5ML suspension 30 mL  30 mL Oral Q4H PRN Nanine MeansJamison Lord, NP      . hydrOXYzine (ATARAX/VISTARIL) tablet 50 mg  50 mg Oral TID PRN Nanine MeansJamison Lord, NP   50 mg at 06/19/14 1609  . ibuprofen (ADVIL,MOTRIN) tablet 600 mg  600 mg Oral Q8H PRN Nanine MeansJamison Lord, NP   600 mg at 06/15/14 2158  . LORazepam (ATIVAN) tablet 1 mg  1 mg Oral Q6H PRN Rachael FeeIrving A Lugo, MD   1 mg at 06/18/14 0836  . magnesium hydroxide (MILK OF MAGNESIA) suspension 30 mL  30 mL Oral Daily PRN Nanine MeansJamison Lord,  NP      . mirtazapine (REMERON) tablet 30 mg  30 mg Oral QHS Rachael FeeIrving A Lugo, MD   30 mg at 06/18/14 2144  . ondansetron (ZOFRAN) tablet 4 mg  4 mg Oral Q8H PRN Nanine MeansJamison Lord, NP        Lab Results: No results found for this or any previous visit (from the past 48 hour(s)).  Physical Findings: AIMS: Facial and Oral Movements Muscles of Facial Expression: None, normal Lips and Perioral Area: None, normal Jaw: None, normal Tongue: None, normal,Extremity Movements Upper (arms, wrists, hands,  fingers): None, normal Lower (legs, knees, ankles, toes): None, normal, Trunk Movements Neck, shoulders, hips: None, normal, Overall Severity Severity of abnormal movements (highest score from questions above): None, normal Incapacitation due to abnormal movements: None, normal Patient's awareness of abnormal movements (rate only patient's report): No Awareness, Dental Status Current problems with teeth and/or dentures?: No Does patient usually wear dentures?: No  CIWA:  CIWA-Ar Total: 7 COWS:  COWS Total Score: 9  Treatment Plan Summary: Daily contact with patient to assess and evaluate symptoms and progress in treatment Medication management  Plan: Supportive approach/coping skills/relapse prevention           D/C the Remeron           Try Ambien 10 mg HS PRN sleep VS. Seroquel 50 mg  Medical Decision Making Problem Points:  Established problem, worsening (2) and Review of psycho-social stressors (1) Data Points:  Review of medication regiment & side effects (2)  I certify that inpatient services furnished can reasonably be expected to improve the patient's condition.   LUGO,IRVING A 06/19/2014, 5:29 PM

## 2014-06-19 NOTE — Tx Team (Signed)
Interdisciplinary Treatment Plan Update (Adult)  Date: 06/19/2014   Time Reviewed: 10:56 AM  Progress in Treatment:  Attending groups: yes Participating in groups: yes   Taking medication as prescribed: Yes  Tolerating medication: Yes  Family/Significant othe contact made: Not needed. SPE not required for this pt.   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  Discussing patient identified problems/goals with staff: Yes  Medical problems stabilized or resolved: Yes  Denies suicidal/homicidal ideation: Yes during admission, group, and self report.  Patient has not harmed self or Others: Yes  New problem(s) identified:  Discharge Plan or Barriers: Pt has [facility] interview with Melissa this morning. Hoping for bed tomorrow. [facility] for med management/outpatient services. CSW assessing. Pt is also working with [facility] to get scholarship for program-contact person: Designer, multimediaCindy Pizzino at [facility].  Additional comments: 33 Y/O male who states he was shooting meth. Has been doing meth for 6 years. Shooting for the last two years. States that he relapsed after he told on a guy. Relapsed on meth. States uses cocaine, weed. Claims that after he told on that guy he is being threatened and is afraid for his life. Admits to persistent depression, mood swings, and unable to rest having persistent worry  Reason for Continuation of Hospitalization: Medication management Mood stabilization Estimated length of stay: 3-4 days  For review of initial/current patient goals, please see plan of care.  Attendees:  Patient:    Family:    Physician: Geoffery LyonsIrving Lugo MD  06/19/2014 10:56 AM   Nursing: Lupita Leashonna RN  06/19/2014 10:56 AM   Clinical Social Worker Heather Smart, LCSWA  06/19/2014 10:56 AM   Other: Okey Regalarol RN  06/19/2014 10:56 AM   Other:    Other: Jan RN 06/19/2014 10:56 AM   Other:    Scribe for Treatment Team:  Trula SladeHeather Smart  LCSWA 06/19/2014 10:56 AM

## 2014-06-19 NOTE — Clinical Social Work Note (Signed)
Pt not accepted to [facility] due to length of drug use and amount of use-per treatment centers, he does not meet inpatient criteria based on this information. CSW working with Eula Listenindy Pizzino from [facility] in attempt to get patient scholarship to attend treatment center-currently waiting for bed availability.   The Sherwin-WilliamsHeather Smart, LCSWA  06/19/2014 3:39 PM

## 2014-06-19 NOTE — Progress Notes (Signed)
Child/Adolescent Psychoeducational Group Note  Date:  06/19/2014 Time:  10:06 PM  Group Topic/Focus:  Wrap-Up Group:   The focus of this group is to help patients review their daily goal of treatment and discuss progress on daily workbooks.  Participation Level:  Active  Participation Quality:  Appropriate and Attentive  Affect:  Appropriate  Cognitive:  Appropriate  Insight:  Appropriate  Engagement in Group:  Engaged  Modes of Intervention:  Discussion  Additional Comments:  Pt attended the wrap up group this evening and remained appropriate and engaged throughout the duration of the group. Pt ranked his day as a 5 because his day was ok. Pt stated that one positive thing about his day is the fact that he's still alive.  Fara Oldeneese, Jonathon O 06/19/2014, 10:06 PM

## 2014-06-19 NOTE — Progress Notes (Signed)
Recreation Therapy Notes  Animal-Assisted Activity/Therapy (AAA/T) Program Checklist/Progress Notes Patient Eligibility Criteria Checklist & Daily Group note for Rec Tx Intervention  Date: 06.30.2015 Time: 2:45 Location: 300 Hall Dayroom    AAA/T Program Assumption of Risk Form signed by Patient/ or Parent Legal Guardian yes  Patient is free of allergies or sever asthma yes  Patient reports no fear of animals yes  Patient reports no history of cruelty to animals yes   Patient understands his/her participation is voluntary yes  Patient washes hands before animal contact yes  Patient washes hands after animal contact yes  Behavioral Response: Appropriate   Education: Hand Washing, Appropriate Animal Interaction   Education Outcome: Acknowledges understanding   Clinical Observations/Feedback: Patient interacted appropriately with therapeutic dog team and peers during session.   Zamyiah Tino L Erhard Senske, LRT/CTRS  Cassady Stanczak L 06/19/2014 4:30 PM 

## 2014-06-19 NOTE — Progress Notes (Signed)
The focus of this group is to educate the patient on the purpose and policies of crisis stabilization and provide a format to answer questions about their admission.  The group details unit policies and expectations of patients while admitted. Patient did not attend. 

## 2014-06-19 NOTE — Progress Notes (Signed)
   Pt laying in bed resting with eyes closed. Respirations even and unlabored. No distress noted. 15 min checks continued for safety. 

## 2014-06-19 NOTE — Progress Notes (Signed)
Patient ID: Kyle Wilkins, male   DOB: 07-11-1981, 33 y.o.   MRN: [id number] D- Patient reports he slept fair and his appetite is improving.  His energy level is normal and his ability to pay attention is improving.  He is rating his depression at 4/10 and his hopelessness at 4/10.  He is attending some groups.  He was hoping to go for residential treatment and was disappointed to hear "My drug problem isn't bad enough" to be accepted.  Patient was feeling overwhelmed and anxious after getting this news   A- supported patient.  Encouraged him to do some problem solving and called social worker to update her.   Patient calmer after talking and starting to try to think of alternative safe plans.

## 2014-06-19 NOTE — [facility] Group Notes (Signed)
[facility] LCSW Group Therapy  06/19/2014 2:56 PM  Type of Therapy:  Group Therapy  Participation Level:  Active  Participation Quality:  Attentive  Affect:  Appropriate  Cognitive:  Oriented  Insight:  Engaged  Engagement in Therapy:  Engaged  Modes of Intervention:  Confrontation, Discussion, Education, Exploration, Problem-solving, Rapport Building, Socialization and Support  Summary of Progress/Problems: MHA Speaker came to talk about his personal journey with substance abuse and addiction. Montrelle processed ways by which to relate to the speaker. MHA speaker provided handouts and educational information pertaining to groups and services offered by [facility].    Smart, Heather LCSWA  06/19/2014, 2:56 PM

## 2014-06-20 DIAGNOSIS — F909 Attention-deficit hyperactivity disorder, unspecified type: Secondary | ICD-10-CM

## 2014-06-20 DIAGNOSIS — F39 Unspecified mood [affective] disorder: Secondary | ICD-10-CM

## 2014-06-20 MED ORDER — QUETIAPINE FUMARATE 100 MG PO TABS
100.0000 mg | ORAL_TABLET | Freq: Every day | ORAL | Status: DC
  Administered 2014-06-20 – 2014-06-21 (×2): 100 mg via ORAL
  Filled 2014-06-20: qty 1
  Filled 2014-06-20: qty 14
  Filled 2014-06-20 (×3): qty 1

## 2014-06-20 MED ORDER — GABAPENTIN 300 MG PO CAPS
300.0000 mg | ORAL_CAPSULE | Freq: Three times a day (TID) | ORAL | Status: DC
  Administered 2014-06-20 – 2014-06-22 (×7): 300 mg via ORAL
  Filled 2014-06-20: qty 42
  Filled 2014-06-20 (×3): qty 1
  Filled 2014-06-20: qty 42
  Filled 2014-06-20 (×5): qty 1
  Filled 2014-06-20: qty 42
  Filled 2014-06-20 (×2): qty 1

## 2014-06-20 NOTE — Progress Notes (Signed)
Patient ID: Kyle Wilkins, male   DOB: [birth year], 33 y.o.   MRN: [id number] D: pt. In dayroom playing cards, reports anxiety at "5" of 10. A: Writer introduced self to client reviewed medication administration times, encouraged group. Staff will monitor q5415min for safety. R: Pt. Is safe on the unit, attends group.

## 2014-06-20 NOTE — Progress Notes (Signed)
Pt has been up and active in the milieu today. He rated all his depression, hopelessness and anxiety a 5 on his self-inventory. He denied any S/H ideation or A/V/H.  He was started on gabapentin to help with his anxiety.  He feels this is working for him.  He is unsure where he will go from here.

## 2014-06-20 NOTE — [facility] Group Notes (Signed)
Adult Psychoeducational Group Note  Date:  06/20/2014 Time:  10:34 PM  Group Topic/Focus:  Personal Choices and Values:   The focus of this group is to help patients assess and explore the importance of values in their lives, how their values affect their decisions, how they express their values and what opposes their expression.  Participation Level:  Active  Participation Quality:  Appropriate  Affect:  Appropriate  Cognitive:  Alert  Insight: Appropriate  Engagement in Group:  Engaged  Modes of Intervention:  Discussion  Additional Comments:  Pt rated energy level a 5 out of 10. 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  Pt stated [facility] is [facility].  Ledora BottcherHolcomb, Ygnacio Fecteau G 06/20/2014, 10:34 PM

## 2014-06-20 NOTE — [facility] Group Notes (Signed)
[name]   06/20/2014 11:11 AM  Participation Quality: Appropriate    Mood/Affect:  Depressed and Flat  Depression Rating:  2  Anxiety Rating:  2  Thoughts of Suicide:  No Will you contract for safety?   NA  Current AVH:  No  Plan for Discharge/Comments:  Pt reports that [facility] is [facility]. [facility] denied pt yesterday. [facility] referral pending-likely denial for same reason as [facility] (limited SA). Pt [facility]   Transportation Means: [facility] if pt accepted.   Supports: Gaffermother/family   Smart, [name]

## 2014-06-20 NOTE — [facility] Group Notes (Signed)
[facility] LCSW Group Therapy  06/20/2014 3:24 PM  Type of Therapy:  Group Therapy  Participation Level:  Did Not Attend-pt had phone interview with [facility] during afternoon group. Absence was excused   Smart, Herbert SetaHeather LCSWA  06/20/2014, 3:24 PM

## 2014-06-20 NOTE — Progress Notes (Deleted)
Pt up after wetting the bed, stating he was dreaming he was in the bathroom and when he woke up, he was wet.  Pt's bed linens were changed and pt showered before returning to bed.  Pt commented that this had never happened to him before.  Pt monitored with q15 minute checks.

## 2014-06-20 NOTE — Progress Notes (Signed)
[facility]  06/20/2014 3:11 PM Kyle Wilkins  MRN:  [id number] Subjective:  Kyle Wilkins endorses that he did sleep some better last night. He is experiencing some irritability. States he really needs to get his life together. Afraid of relapsing if he was not to pursue any further treatment.  Diagnosis:   DSM5: Schizophrenia Disorders:  none Obsessive-Compulsive Disorders:  none Trauma-Stressor Disorders:  none Substance/Addictive Disorders:  Methamphetamine dependence Depressive Disorders:  none Total Time spent with patient: 30 minutes  Axis I: ADHD, combined type, Mood Disorder NOS and Substance Induced Mood Disorder  ADL's:  Intact  Sleep: better than the night before  Appetite:  Fair  Suicidal Ideation:  Plan:  denies Intent:  denies Means:  denies Homicidal Ideation:  Plan:  denies Intent:  denies Means:  denies AEB (as evidenced by):  Psychiatric Specialty Exam: Physical Exam  Review of Systems  Constitutional: Negative.   HENT: Negative.   Eyes: Negative.   Respiratory: Negative.   Cardiovascular: Negative.   Gastrointestinal: Negative.   Genitourinary: Negative.   Musculoskeletal: Negative.   Skin: Negative.   Neurological: Negative.   Endo/Heme/Allergies: Negative.   Psychiatric/Behavioral: Positive for substance abuse. The patient is nervous/anxious and has insomnia.     Blood pressure 117/80, pulse 74, temperature 97.5 F (36.4 C), temperature source Oral, resp. rate 16, height 5\' 9"  (1.753 m), weight 70.761 kg (156 lb), SpO2 99.00%.Body mass index is 23.03 kg/(m^2).  General Appearance: Fairly Groomed  Patent attorneyye Contact::  Fair  Speech:  Clear and Coherent  Volume:  fluctuates  Mood:  Anxious, Irritable and frustrated  Affect:  anxious, worried  Thought Process:  Coherent and Goal Directed  Orientation:  Full (Time, Place, and Person)  Thought Content:  symptoms worries concerns  Suicidal Thoughts:  No  Homicidal Thoughts:  No  Memory:   Immediate;   Fair Recent;   Fair Remote;   Fair  Judgement:  Fair  Insight:  Present  Psychomotor Activity:  Restlessness  Concentration:  Fair  Recall:  [name]  Fund of Knowledge:NA  Language: Fair  Akathisia:  No  Handed:    AIMS (if indicated):     Assets:  Desire for Improvement Social Support  Sleep:  Number of Hours: 5.75   Musculoskeletal: Strength & Muscle Tone: within normal limits Gait & Station: normal Patient leans: N/A  Current Medications: Current Facility-Administered Medications  Medication Dose Route Frequency Provider Last Rate Last Dose  . acetaminophen (TYLENOL) tablet 650 mg  650 mg Oral Q6H PRN Nanine MeansJamison Lord, NP      . albuterol (PROVENTIL HFA;VENTOLIN HFA) 108 (90 BASE) MCG/ACT inhaler 2 puff  2 puff Inhalation Q6H PRN Nanine MeansJamison Lord, NP      . alum & mag hydroxide-simeth (MAALOX/MYLANTA) 200-200-20 MG/5ML suspension 30 mL  30 mL Oral Q4H PRN Nanine MeansJamison Lord, NP      . gabapentin (NEURONTIN) capsule 300 mg  300 mg Oral TID Rachael FeeIrving A Kambryn Dapolito, MD   300 mg at 06/20/14 1112  . hydrOXYzine (ATARAX/VISTARIL) tablet 50 mg  50 mg Oral TID PRN Nanine MeansJamison Lord, NP   50 mg at 06/20/14 0851  . ibuprofen (ADVIL,MOTRIN) tablet 600 mg  600 mg Oral Q8H PRN Nanine MeansJamison Lord, NP   600 mg at 06/15/14 2158  . LORazepam (ATIVAN) tablet 1 mg  1 mg Oral Q6H PRN Rachael FeeIrving A Kamri Gotsch, MD   1 mg at 06/18/14 0836  . magnesium hydroxide (MILK OF MAGNESIA) suspension 30 mL  30 mL Oral Daily PRN  Nanine MeansJamison Lord, NP      . ondansetron [facility](ZOFRAN) tablet 4 mg  4 mg Oral Q8H PRN Nanine MeansJamison Lord, NP      . QUEtiapine (SEROQUEL) tablet 50 mg  50 mg Oral QHS Rachael FeeIrving A Vinh Sachs, MD   50 mg at 06/19/14 2204    Lab Results: No results found for this or any previous visit (from the past 48 hour(s)).  Physical Findings: AIMS: Facial and Oral Movements Muscles of Facial Expression: None, normal Lips and Perioral Area: None, normal Jaw: None, normal Tongue: None, normal,Extremity Movements Upper (arms, wrists, hands, fingers):  None, normal Lower (legs, knees, ankles, toes): None, normal, Trunk Movements Neck, shoulders, hips: None, normal, Overall Severity Severity of abnormal movements (highest score from questions above): None, normal Incapacitation due to abnormal movements: None, normal Patient's awareness of abnormal movements (rate only patient's report): No Awareness, Dental Status Current problems with teeth and/or dentures?: No Does patient usually wear dentures?: No  CIWA:  CIWA-Ar Total: 7 COWS:  COWS Total Score: 9  Treatment Plan Summary: Daily contact with patient to assess and evaluate symptoms and progress in treatment Medication management  Plan: Supportive approach/coping skills/relapse prevention           Will increase the Neurontin to 300 mg TID           Seroquel 100 mg HS           Facilitate admission to rehab program Medical Decision Making Problem Points:  Review of psycho-social stressors (1) Data Points:  Review of medication regiment & side effects (2) Review of new medications or change in dosage (2)  I certify that inpatient services furnished can reasonably be expected to improve the patient's condition.   Berenice Oehlert A 06/20/2014, 3:11 PM

## 2014-06-21 NOTE — [facility] Group Notes (Signed)
[facility] LCSW Group Therapy  06/21/2014 3:32 PM  Type of Therapy:  Group Therapy  Participation Level:  Active  Participation Quality:  Attentive  Affect:  Appropriate  Cognitive:  Alert and Oriented  Insight:  Engaged  Engagement in Therapy:  Engaged  Modes of Intervention:  Confrontation, Discussion, Education, Exploration, Problem-solving, Rapport Building, Socialization and Support  Summary of Progress/Problems:  Finding Balance in Life. Today's group focused on defining balance in one's own words, identifying things that can knock one off balance, and exploring healthy ways to maintain balance in life. Group members were asked to provide an example of a time when they felt off balance, describe how they handled that situation,and process healthier ways to regain balance in the future. Group members were asked to share the most important tool for maintaining balance that they learned [facility] and how they plan to apply this method after discharge. Kyle Wilkins was attentive and engaged throughout today's therapy group. He shared that balance revolves around sobriety and family relationships. Kyle Wilkins stated that he has supportive parents and is working with CSW to [facility]-waiting for bed availability. Kyle Wilkins shows progress in the group setting and improving insight AEB his ability to process how treatment and continued family support/involvment in his recovery will help him reestablish a sense of balance in life.    Smart, Kyle Wilkins LCSWA  06/21/2014, 3:32 PM

## 2014-06-21 NOTE — Progress Notes (Signed)
Recreation Therapy Notes  Animal-Assisted Activity/Therapy (AAA/T) Program Checklist/Progress Notes Patient Eligibility Criteria Checklist & Daily Group note for Rec Tx Intervention  Date: 07.02.2015 Time: 2:45pm Location: 300 Hall Dayroom   AAA/T Program Assumption of Risk Form signed by Patient/ or Parent Legal Guardian yes  Patient is free of allergies or sever asthma yes  Patient reports no fear of animals yes  Patient reports no history of cruelty to animals yes   Patient understands his/her participation is voluntary yes  Behavioral Response: Did not attend.   Kyle Wilkins, LRT/CTRS  Soham Hollett L 06/21/2014 4:08 PM 

## 2014-06-21 NOTE — [facility] Group Notes (Signed)
0900 nursing orientation group    The focus of this group is to educate the patient on the purpose and policies of crisis stabilization and provide a format to answer questions about their admission.  The group details unit policies and expectations of patients while admitted.   Pt was an active participant in group and appropriate in sharing his goal today,"working on getting into [facility] for treatment"

## 2014-06-21 NOTE — Progress Notes (Signed)
[facility]  06/21/2014 4:53 PM Kyle Wilkins  MRN:  [id number] Subjective:  Kyle Wilkins states that he is starting to feel better. He was able to sleep and the Neurontin seems to be helping with the pain. He is committed to abstinence and wants to pursue rehab. States after rehab he might relocate. States that there are people who seem to find him no matter what and bring the meth to him. States that one hit and he will be back using out of control. States the Adderall help with his focus but the meth IV did much more than that Concerned about the duration of the withdrawal Diagnosis:   DSM5: Schizophrenia Disorders:  none Obsessive-Compulsive Disorders:  none Trauma-Stressor Disorders:  none Substance/Addictive Disorders:  Methamphetamine use disorder Depressive Disorders:  Major Depressive Disorder - Mild (296.21) Total Time spent with patient: 30 minutes  Axis I: ADHD, combined type and Anxiety Disorder NOS  ADL's:  Intact  Sleep: Fair  Appetite:  Fair  Suicidal Ideation:  Plan:  denies Intent:  denies Means:  denies Homicidal Ideation:  Plan:  denies Intent:  denies Means:  denies AEB (as evidenced by):  Psychiatric Specialty Exam: Physical Exam  Review of Systems  Constitutional: Negative.   HENT: Negative.   Eyes: Negative.   Respiratory: Negative.   Cardiovascular: Negative.   Gastrointestinal: Negative.   Genitourinary: Negative.   Musculoskeletal: Negative.   Skin: Negative.   Neurological: Negative.   Endo/Heme/Allergies: Negative.   Psychiatric/Behavioral: Positive for depression and substance abuse. The patient is nervous/anxious.     Blood pressure 108/74, pulse 59, temperature 97.5 F (36.4 C), temperature source Oral, resp. rate 18, height 5\' 9"  (1.753 m), weight 70.761 kg (156 lb), SpO2 99.00%.Body mass index is 23.03 kg/(m^2).  General Appearance: Fairly Groomed  Patent attorneyye Contact::  Fair  Speech:  Clear and Coherent  Volume:  Normal  Mood:   Anxious and worried  Affect:  anxious, worried  Thought Process:  Coherent and Goal Directed  Orientation:  Full (Time, Place, and Person)  Thought Content:  symptoms worries concerns   Suicidal Thoughts:  No  Homicidal Thoughts:  No  Memory:  Immediate;   Fair Recent;   Fair Remote;   Fair  Judgement:  Fair  Insight:  Present  Psychomotor Activity:  Restlessness  Concentration:  Fair  Recall:  [name]  Fund of Knowledge:NA  Language: Fair  Akathisia:  No  Handed:    AIMS (if indicated):     Assets:  Desire for Improvement  Sleep:  Number of Hours: 5.75   Musculoskeletal: Strength & Muscle Tone: within normal limits Gait & Station: normal Patient leans: N/A  Current Medications: Current Facility-Administered Medications  Medication Dose Route Frequency Provider Last Rate Last Dose  . acetaminophen (TYLENOL) tablet 650 mg  650 mg Oral Q6H PRN Nanine MeansJamison Lord, NP      . albuterol (PROVENTIL HFA;VENTOLIN HFA) 108 (90 BASE) MCG/ACT inhaler 2 puff  2 puff Inhalation Q6H PRN Nanine MeansJamison Lord, NP      . alum & mag hydroxide-simeth (MAALOX/MYLANTA) 200-200-20 MG/5ML suspension 30 mL  30 mL Oral Q4H PRN Nanine MeansJamison Lord, NP      . gabapentin (NEURONTIN) capsule 300 mg  300 mg Oral TID Rachael FeeIrving A Magdalyn Arenivas, MD   300 mg at 06/21/14 1157  . hydrOXYzine (ATARAX/VISTARIL) tablet 50 mg  50 mg Oral TID PRN Nanine MeansJamison Lord, NP   50 mg at 06/20/14 2209  . ibuprofen (ADVIL,MOTRIN) tablet 600 mg  600 mg  Oral Q8H PRN Nanine MeansJamison Lord, NP   600 mg at 06/15/14 2158  . LORazepam (ATIVAN) tablet 1 mg  1 mg Oral Q6H PRN Rachael FeeIrving A Missy Baksh, MD   1 mg at 06/18/14 0836  . magnesium hydroxide (MILK OF MAGNESIA) suspension 30 mL  30 mL Oral Daily PRN Nanine MeansJamison Lord, NP      . ondansetron (ZOFRAN) tablet 4 mg  4 mg Oral Q8H PRN Nanine MeansJamison Lord, NP      . QUEtiapine (SEROQUEL) tablet 100 mg  100 mg Oral QHS Rachael FeeIrving A Rennie Hack, MD   100 mg at 06/20/14 2209    Lab Results: No results found for this or any previous visit (from the past 48  hour(s)).  Physical Findings: AIMS: Facial and Oral Movements Muscles of Facial Expression: None, normal Lips and Perioral Area: None, normal Jaw: None, normal Tongue: None, normal,Extremity Movements Upper (arms, wrists, hands, fingers): None, normal Lower (legs, knees, ankles, toes): None, normal, Trunk Movements Neck, shoulders, hips: None, normal, Overall Severity Severity of abnormal movements (highest score from questions above): None, normal Incapacitation due to abnormal movements: None, normal Patient's awareness of abnormal movements (rate only patient's report): No Awareness, Dental Status Current problems with teeth and/or dentures?: No Does patient usually wear dentures?: No  CIWA:  CIWA-Ar Total: 7 COWS:  COWS Total Score: 9  Treatment Plan Summary: Daily contact with patient to assess and evaluate symptoms and progress in treatment Medication management  Plan: Supportive approach/coping skills/relapse prevention           Optimize response to the psychotropics           Facilitate admission to a rehab program  Medical Decision Making Problem Points:  Review of psycho-social stressors (1) Data Points:  Review of new medications or change in dosage (2)  I certify that inpatient services furnished can reasonably be expected to improve the patient's condition.   Elinora Weigand A 06/21/2014, 4:53 PM

## 2014-06-21 NOTE — Progress Notes (Signed)
Adult Psychoeducational Group Note  Date:  06/21/2014 Time:  10:42 PM  Group Topic/Focus:  Wrap-Up Group:   The focus of this group is to help patients review their daily goal of treatment and discuss progress on daily workbooks.  Participation Level:  Active  Participation Quality:  Appropriate  Affect:  Appropriate  Cognitive:  Alert  Insight: Appropriate  Engagement in Group:  Engaged  Modes of Intervention:  Activity  Additional Comments:  PT went to group and supported others while they where singing   Kyle Wilkins 06/21/2014, 10:42 PM

## 2014-06-21 NOTE — Progress Notes (Signed)
Pt has been up and active in the milieu today.  He has been going to groups and interacting with his peers and staff appropriately today. He rated both his depression and hopelessness a 4 and his anxiety a 2 on his self-inventory. He denies any S/H ideation or A/V/H. He is [facility] to get [facility].

## 2014-06-21 NOTE — Progress Notes (Signed)
Patient ID: Kyle Wilkins, male   DOB: [birth year], 33 y.o.   MRN: [id number] D: pt. Visible on unit, visited with mom and aunt today, says visit went well. Pt. Reports day been "good," rates depression at "2" and anxiety at "4" of 10. Pt. Plans to enter [place] [facility], after discharge, from there he wants to go [place], with friends who are sober. A: Writer encouraged client to follow through with positive plans. Staff will monitor q5115min for safety. R: Pt. Is safe on the unit, attended karaoke and sang.

## 2014-06-22 DIAGNOSIS — F1520 Other stimulant dependence, uncomplicated: Principal | ICD-10-CM

## 2014-06-22 MED ORDER — GABAPENTIN 300 MG PO CAPS: 300.0000 mg | ORAL_CAPSULE | Freq: Three times a day (TID) | ORAL | Status: AC

## 2014-06-22 MED ORDER — HYDROXYZINE HCL 50 MG PO TABS: 50.0000 mg | ORAL_TABLET | Freq: Three times a day (TID) | ORAL | Status: AC | PRN

## 2014-06-22 MED ORDER — ACETAMINOPHEN 500 MG PO TABS: 1000.0000 mg | ORAL_TABLET | Freq: Four times a day (QID) | ORAL | Status: AC | PRN

## 2014-06-22 MED ORDER — ALBUTEROL SULFATE HFA 108 (90 BASE) MCG/ACT IN AERS: 1.0000 | INHALATION_SPRAY | Freq: Four times a day (QID) | RESPIRATORY_TRACT | Status: AC | PRN

## 2014-06-22 MED ORDER — QUETIAPINE FUMARATE 100 MG PO TABS: 100.0000 mg | ORAL_TABLET | Freq: Every day | ORAL | Status: AC

## 2014-06-22 NOTE — BHH Suicide Risk Assessment (Signed)
Suicide Risk Assessment  Discharge Assessment     Demographic Factors:  Male and Caucasian  Total Time spent with patient: 45 minutes  Psychiatric Specialty Exam:     Blood pressure 121/86, pulse 85, temperature 97.5 F (36.4 C), temperature source Oral, resp. rate 18, height 5\' 9"  (1.753 m), weight 70.761 kg (156 lb), SpO2 99.00%.Body mass index is 23.03 kg/(m^2).  General Appearance: Fairly Groomed  Patent attorneyye Contact::  Fair  Speech:  Clear and Coherent  Volume:  Normal  Mood:  Euthymic  Affect:  Appropriate  Thought Process:  Coherent and Goal Directed  Orientation:  Full (Time, Place, and Person)  Thought Content:  plans as he movees on, relapse prevention plan  Suicidal Thoughts:  No  Homicidal Thoughts:  No  Memory:  Immediate;   Fair Recent;   Fair Remote;   Fair  Judgement:  Fair  Insight:  Present  Psychomotor Activity:  Normal  Concentration:  Fair  Recall:  [name]  Fund of Knowledge:NA  Language: Fair  Akathisia:  No  Handed:    AIMS (if indicated):     Assets:  Desire for Improvement Social Support  Sleep:  Number of Hours: 5.75    Musculoskeletal: Strength & Muscle Tone: within normal limits Gait & Station: normal Patient leans: N/A   Mental Status Per Nursing Assessment::   On Admission:  NA  Current Mental Status by Physician: In full contact with reality. There are no active S/S of withdrawal. His mood is euthymic, his affect is appropriate. No SI plans or intent. He is willing and motivated to pursue a residential treatment program.([facility])   Loss Factors: NA  Historical Factors: NA  Risk Reduction Factors:   Employed and Positive social support  Continued Clinical Symptoms:  Alcohol/Substance Abuse/Dependencies  Cognitive Features That Contribute To Risk:  Closed-mindedness Polarized thinking Thought constriction (tunnel vision)    Suicide Risk:  Minimal: No identifiable suicidal ideation.  Patients presenting with no  risk factors but with morbid ruminations; may be classified as minimal risk based on the severity of the depressive symptoms  Discharge Diagnoses:   AXIS I:  Methamphetamine Dependence, ADHD, Substance Induced Mood Disorder AXIS II:  No diagnosis AXIS III:   Past Medical History  Diagnosis Date  . Asthma   . Polysubstance abuse    AXIS IV:  other psychosocial or environmental problems AXIS V:  61-70 mild symptoms  Plan Of Care/Follow-up recommendations:  Activity:  as tolerated Diet:  regular Follow up [facility]  Is patient on multiple antipsychotic therapies at discharge:  No   Has Patient had three or more failed trials of antipsychotic monotherapy by history:  No  Recommended Plan for Multiple Antipsychotic Therapies: NA    Yudith Norlander A 06/22/2014, 12:54 PM

## 2014-06-22 NOTE — Discharge Summary (Signed)
Physician Discharge Summary Note  Patient:  Kyle Wilkins is an 33 y.o., male MRN:  [id number] DOB:  10-31-1981 Patient phone:  [phone] (home)  Patient address:   [address] [place] [address],  Total Time spent with patient: Greater than 230 minutes  Date of Admission:  06/15/2014 Date of Discharge: 06/22/14  Reason for Admission: Drug detox  Discharge Diagnoses: Active Problems:   Methamphetamine dependence   Substance induced mood disorder   Adult ADHD   Psychiatric Specialty Exam: Physical Exam  Psychiatric: His speech is normal and behavior is normal. Judgment and thought content normal. His mood appears not anxious. His affect is not angry, not blunt, not labile and not inappropriate. Cognition and memory are normal. He does not exhibit a depressed mood.    Review of Systems  Constitutional: Negative.   HENT: Negative.   Eyes: Negative.   Respiratory: Negative.   Cardiovascular: Negative.   Gastrointestinal: Negative.   Genitourinary: Negative.   Musculoskeletal: Negative.   Skin: Negative.   Neurological: Negative.   Endo/Heme/Allergies: Negative.   Psychiatric/Behavioral: Positive for depression (Stable) and substance abuse (Methamphetamine dependence). Negative for suicidal ideas, hallucinations and memory loss. The patient has insomnia (Stable). The patient is not nervous/anxious.     Blood pressure 121/86, pulse 85, temperature 97.5 F (36.4 C), temperature source Oral, resp. rate 18, height 5\' 9"  (1.753 m), weight 70.761 kg (156 lb), SpO2 99.00%.Body mass index is 23.03 kg/(m^2).   General Appearance: Fairly Groomed   Patent attorney:: Fair   Speech: Clear and Coherent   Volume: Normal   Mood: Euthymic   Affect: Appropriate   Thought Process: Coherent and Goal Directed   Orientation: Full (Time, Place, and Person)   Thought Content: plans as he movees on, relapse prevention plan   Suicidal Thoughts: No   Homicidal Thoughts: No   Memory:  Immediate; Fair  Recent; Fair  Remote; Fair   Judgement: Fair   Insight: Present   Psychomotor Activity: Normal   Concentration: Fair   Recall: [employer] of Knowledge:NA   Language: Fair   Akathisia: No   Handed:   AIMS (if indicated):   Assets: Desire for Improvement  Social Support   Sleep: Number of Hours: 5.75    Past Psychiatric History: Diagnosis: Methamphetamine dependence, Substance induced mood disorder  Hospitalizations: [facility] adult unit  Outpatient Care: [facility]  Substance Abuse Care: [facility]  Self-Mutilation: NA  Suicidal Attempts: NA  Violent Behaviors: NA   Musculoskeletal: Strength & Muscle Tone: within normal limits Gait & Station: normal Patient leans: N/A  DSM5: Schizophrenia Disorders:  NA Obsessive-Compulsive Disorders:  NA Trauma-Stressor Disorders:  NA Substance/Addictive Disorders:  Methamphetamine dependence Depressive Disorders:  Substance induced mood disorder  Axis Diagnosis:  AXIS I:  Methamphetamine dependence, Substance induced mood disorder AXIS II:  Deferred AXIS III:   Past Medical History  Diagnosis Date  . Asthma   . Polysubstance abuse    AXIS IV:  other psychosocial or environmental problems and Drug dependence AXIS V:  62  Level of Care:  [facility]  Hospital Course:  33 Y/O male who states he was shooting meth. Has been doing meth for 6 years. Shooting for the last two years. States that he relapsed after he told on a guy. Relapsed on meth. States uses cocaine, weed. Claims that after he told on that guy he is being threatened and is afraid for his life. Admits to persistent depression, mood swings, and unable to  rest having persistent worry.  Kyle Wilkins was admitted to the hospital with his UDS test reports showing positive Amphetamine and THC. Kyle Wilkins also was reporting persistent depression and mood instability. He did not receive any drug detox treatment. However, required and received mood stabilization treatment. He  was ordered, received and discharged on Neurontin 300 mg three times daily for substance withdrawal symptoms, Hydroxyzine 50 mg three times daily for anxiety and Trazodone 100 mg Q bedtime for sleep. He was also enrolled in the group counseling sessions and AA/NA meetings being offered and held on this unit. He learned coping skills. He was resumed on all his pertinent home medications for his other medical issues. He tolerated his treatment regimen without any significant adverse effects and or reactions.  Kyle Wilkins has shown a stable mood. This is evidenced by his reports of improved mood and absence of withdrawal symptoms. He is currently being discharged to receive routine psychiatric treatment and medication management at the [facility] [place] in [place], [place]. And for continuation of substance abuse treatment, Kyle Wilkins has a referral to the [facility] [place] in [place]. He is provided with all the necessary information required to contact and make these appointments without problems.   Upon discharge, he adamantly denies any SIHI, AVH, delusional thoughts, paranoia and or withdrawal symptoms. He was provided with a 14 days worth, supply samples [facility]. [facility] with all personal belongings in no distress. Transportation per [employer].  Consults:  psychiatry  Significant Diagnostic Studies:  labs: CBC with diff, CMP, UDS, toxicology tests, U/A  Discharge Vitals:   Blood pressure 121/86, pulse 85, temperature 97.5 F (36.4 C), temperature source Oral, resp. rate 18, height 5\' 9"  (1.753 m), weight 70.761 kg (156 lb), SpO2 99.00%. Body mass index is 23.03 kg/(m^2). Lab Results:   No results found for this or any previous visit (from the past 72 hour(s)).  Physical Findings: AIMS: Facial and Oral Movements Muscles of Facial Expression: None, normal Lips and Perioral Area: None, normal Jaw: None, normal Tongue: None, normal,Extremity Movements Upper (arms, wrists,  hands, fingers): None, normal Lower (legs, knees, ankles, toes): None, normal, Trunk Movements Neck, shoulders, hips: None, normal, Overall Severity Severity of abnormal movements (highest score from questions above): None, normal Incapacitation due to abnormal movements: None, normal Patient's awareness of abnormal movements (rate only patient's report): No Awareness, Dental Status Current problems with teeth and/or dentures?: No Does patient usually wear dentures?: No  CIWA:  CIWA-Ar Total: 7 COWS:  COWS Total Score: 9  Psychiatric Specialty Exam: See Psychiatric Specialty Exam and Suicide Risk Assessment completed by Attending Physician prior to discharge.  Discharge destination:  Home  Is patient on multiple antipsychotic therapies at discharge:  No   Has Patient had three or more failed trials of antipsychotic monotherapy by history:  No  Recommended Plan for Multiple Antipsychotic Therapies: NA    Medication List       Indication   acetaminophen 500 MG tablet  Commonly known as:  TYLENOL  Take 2 tablets (1,000 mg total) by mouth every 6 (six) hours as needed for mild pain.   Indication:  Pain     albuterol 108 (90 BASE) MCG/ACT inhaler  Commonly known as:  PROVENTIL HFA;VENTOLIN HFA  Inhale 1-2 puffs into the lungs every 6 (six) hours as needed for wheezing or shortness of breath.   Indication:  Asthma     gabapentin 300 MG capsule  Commonly known as:  NEURONTIN  Take 1  capsule (300 mg total) by mouth 3 (three) times daily. For substance withdrawal syndrome   Indication:  Substance withdrawal syndrome     hydrOXYzine 50 MG tablet  Commonly known as:  ATARAX/VISTARIL  Take 1 tablet (50 mg total) by mouth 3 (three) times daily as needed (anxiety).   Indication:  Tension, Anxiety     QUEtiapine 100 MG tablet  Commonly known as:  SEROQUEL  Take 1 tablet (100 mg total) by mouth at bedtime. For mood control   Indication:  Mood control       Follow-up Information    Follow up with [facility]. (Walk in between 8am-9am Monday through Friday for hospital follow-up/medication management/assessment for therapy services. )    Contact information:   [address] [place], [place] [address] Phone: [phone] Fax: [phone]      Follow up with [facility] [place]. Lynden Ang(Cathy at treatment center is working to secure scholarship bed for you. Call her at discharge to check bed availability. )    Contact information:   [address] [place], [place] [address] Phone: [phone] Fax: [phone]     Follow-up recommendations: Activity:  As tolerated Diet: As recommended by your primary care doctor. Keep all scheduled follow-up appointments as recommended.    Comments: Take all your medications as prescribed by your mental healthcare provider. Report any adverse effects and or reactions from your medicines to your outpatient provider promptly. Patient is instructed and cautioned to not engage in alcohol and or illegal drug use while on prescription medicines. In the event of worsening symptoms, patient is instructed to call the crisis hotline, 911 and or go to the nearest ED for appropriate evaluation and treatment of symptoms. Follow-up with your primary care provider for your other medical issues, concerns and or health care needs.  Total Discharge Time:  Greater than 30 minutes.  Signed: Armandina StammerNwoko, Agnes I, PMHNP-BC 06/22/2014, 9:56 AM  I personally assessed the patient and formulated the plan Madie RenoIrving A. Dub MikesLugo, M.D.

## 2014-06-22 NOTE — Progress Notes (Signed)
[facility] Adult [facility] Discharge Plan :  Will you be returning to the same living situation after discharge: Yes,  home with aunt until [facility] [place] (today or weekend) (Scholarship bed) At discharge, do you have transportation home?:Yes,  aunt Do you have the ability to pay for your medications:Yes,  mental health  Release of information consent forms completed and submitted to Medical Records by CSW.  Patient to Follow up at: Follow-up Information   Follow up with [facility]. (Walk in between 8am-9am Monday through Friday for hospital follow-up/medication management/assessment for therapy services. )    Contact information:   [address] [place], [place] [address] Phone: [phone] Fax: [phone]      [facility] with [facility] [place]. Arline Asp(Cindy at treatment center is working to secure scholarship bed for you. Call her at discharge to check bed availability. )    Contact information:   [address] [place], [place] [address] Phone: [phone] Fax: [phone]      Patient denies SI/HI:   Yes,  druing admission, group, and self report.     Safety Planning and Suicide Prevention discussed:  Yes,  SPE not required for this pt. SPI pamphlet provided to pt and he was encouraged to share information with support network, ask questions, and talk about any concerns relating to SPE.  Smart, Safiyyah Vasconez LCSWA  06/22/2014, 11:29 AM

## 2014-06-22 NOTE — Progress Notes (Signed)
Patient discharged per physician order; patient denies SI/HI and A/V hallucinations; patient received samples, copy of AVS, and prescriptions after it was reviewed; patient had no other questions or concerns at this time; patient verbalized and signed that he received all belongings; patient left the unit ambulatory 

## 2014-06-22 NOTE — BHH Group Notes (Signed)
Adult Psychoeducational Group Note  Date:  06/22/2014 Time:  10:54 AM  Group Topic/Focus:  Healthy Communication:   The focus of this group is to discuss communication, barriers to communication, as well as healthy ways to communicate with others.  Participation Level:  Active  Participation Quality:  Appropriate and Attentive  Affect:  Appropriate  Cognitive:  Alert, Appropriate and Oriented  Insight: Good  Engagement in Group:  Engaged  Modes of Intervention:  Discussion, Exploration and Socialization  Additional Comments: He was very engaged in the group discussion. He provided feedback when we where making a list of "Do's and Don'ts of Communication". He provided that when people say "uh,uh" in conversation he felt that the person may not be listening to him which made him feel like they weren't understanding or cared about what he was saying.   Ronelle NighCook, Lucienne Sawyers D 06/22/2014, 10:54 AM

## 2014-06-22 NOTE — BHH Group Notes (Signed)
[facility] LCS[facility] After[facility] Discharge Planning Group Note   06/22/2014 9:56 AM  Participation Quality:  Appropriate   Mood/Affect:  Appropriate  Depression Rating:  2  Anxiety Rating:  2  Thoughts of Suicide:  No Will you contract for safety?   NA  Current AVH:  No  Plan for Discharge/Comments:  Pt is d/cing today and will continue working with [facility] to for [facility] when bed becomes available. [facility] for med management and he will stay with aunt until accepted into treatment.   Transportation Means: aunt  Supports: family supports   Counselling psychologistmart, [name]

## 2014-06-26 NOTE — Progress Notes (Signed)
Patient Discharge Instructions:  After Visit Summary (AVS):   Faxed to:  06/26/14 Discharge Summary Note:   Faxed to:  06/26/14 Psychiatric Admission Assessment Note:   Faxed to:  06/26/14 Suicide Risk Assessment - Discharge Assessment:   Faxed to:  06/26/14 Faxed/Sent to the Next Level Care provider:  06/26/14 Faxed to [facility] [place] @ [phone] Faxed to [facility] @ [phone]  Jerelene ReddenSheena E Bremond, 06/26/2014, 3:35 PM
